# Patient Record
Sex: Female | Born: 1970 | Race: Black or African American | Hispanic: No | Marital: Single | Smoking: Never smoker
Health system: Southern US, Community
[De-identification: ages and names within clinical notes are randomized; demographics above are authoritative.]

## PROBLEM LIST (undated history)

## (undated) DIAGNOSIS — I10 Essential (primary) hypertension: Secondary | ICD-10-CM

## (undated) DIAGNOSIS — G35D Multiple sclerosis, unspecified: Secondary | ICD-10-CM

## (undated) DIAGNOSIS — O00109 Unspecified tubal pregnancy without intrauterine pregnancy: Secondary | ICD-10-CM

## (undated) DIAGNOSIS — G35 Multiple sclerosis: Secondary | ICD-10-CM

## (undated) DIAGNOSIS — E7800 Pure hypercholesterolemia, unspecified: Secondary | ICD-10-CM

## (undated) HISTORY — DX: Essential (primary) hypertension: I10

## (undated) HISTORY — DX: Multiple sclerosis: G35

## (undated) HISTORY — DX: Pure hypercholesterolemia, unspecified: E78.00

## (undated) HISTORY — DX: Multiple sclerosis, unspecified: G35.D

## (undated) HISTORY — DX: Unspecified tubal pregnancy without intrauterine pregnancy: O00.109

---

## 1997-10-02 ENCOUNTER — Other Ambulatory Visit: Admission: RE | Admit: 1997-10-02 | Discharge: 1997-10-02 | Payer: Self-pay | Admitting: Obstetrics and Gynecology

## 1998-10-08 ENCOUNTER — Other Ambulatory Visit: Admission: RE | Admit: 1998-10-08 | Discharge: 1998-10-08 | Payer: Self-pay | Admitting: Obstetrics and Gynecology

## 1999-10-26 ENCOUNTER — Other Ambulatory Visit: Admission: RE | Admit: 1999-10-26 | Discharge: 1999-10-26 | Payer: Self-pay | Admitting: *Deleted

## 2001-02-03 ENCOUNTER — Other Ambulatory Visit: Admission: RE | Admit: 2001-02-03 | Discharge: 2001-02-03 | Payer: Self-pay | Admitting: Obstetrics and Gynecology

## 2002-02-08 ENCOUNTER — Other Ambulatory Visit: Admission: RE | Admit: 2002-02-08 | Discharge: 2002-02-08 | Payer: Self-pay | Admitting: Obstetrics and Gynecology

## 2003-03-05 ENCOUNTER — Other Ambulatory Visit: Admission: RE | Admit: 2003-03-05 | Discharge: 2003-03-05 | Payer: Self-pay | Admitting: Obstetrics and Gynecology

## 2003-12-07 ENCOUNTER — Encounter: Admission: RE | Admit: 2003-12-07 | Discharge: 2003-12-07 | Payer: Self-pay | Admitting: Family Medicine

## 2003-12-14 ENCOUNTER — Encounter: Admission: RE | Admit: 2003-12-14 | Discharge: 2003-12-14 | Payer: Self-pay | Admitting: Family Medicine

## 2004-02-02 HISTORY — PX: OTHER SURGICAL HISTORY: SHX169

## 2004-06-16 ENCOUNTER — Ambulatory Visit (HOSPITAL_COMMUNITY): Admission: RE | Admit: 2004-06-16 | Discharge: 2004-06-16 | Payer: Self-pay | Admitting: Obstetrics and Gynecology

## 2004-06-16 ENCOUNTER — Encounter (INDEPENDENT_AMBULATORY_CARE_PROVIDER_SITE_OTHER): Payer: Self-pay | Admitting: *Deleted

## 2005-06-05 ENCOUNTER — Encounter: Admission: RE | Admit: 2005-06-05 | Discharge: 2005-06-05 | Payer: Self-pay | Admitting: Neurology

## 2007-09-30 ENCOUNTER — Encounter: Admission: RE | Admit: 2007-09-30 | Discharge: 2007-09-30 | Payer: Self-pay | Admitting: Neurology

## 2008-01-29 ENCOUNTER — Emergency Department (HOSPITAL_COMMUNITY): Admission: EM | Admit: 2008-01-29 | Discharge: 2008-01-29 | Payer: Self-pay | Admitting: Family Medicine

## 2010-06-19 NOTE — Op Note (Signed)
NAMEDENAJA, VERHOEVEN               ACCOUNT NO.:  [id number]   MEDICAL RECORD NO.:  [id number]          PATIENT TYPE:  AMB   LOCATION:  SDC                           FACILITY:  [facility]   PHYSICIAN:  Maxie Better, M.D.DATE OF BIRTH:  12-02-1970   DATE OF PROCEDURE:  06/16/2004  DATE OF DISCHARGE:                                 OPERATIVE REPORT   PREOPERATIVE DIAGNOSES:  1.  Abnormal uterine bleeding.  2.  Endometrial mass.   PROCEDURE:  Diagnostic hysteroscopy, resection of endometrial polyps,  dilation and curettage.   POSTOPERATIVE DIAGNOSES:  1.  Abnormal uterine bleeding.  2.  Endometrial mass.   SURGEON:  Maxie Better, M.D.   ANESTHESIA:  General, paracervical block.   PROCEDURE:  Under adequate general anesthesia, the patient was placed in the  dorsal lithotomy position.  She was sterilely prepped and draped in the  usual fashion.  The bladder was catheterized for a small amount of urine.  A  laminaria and vaginal packing were removed.  The vagina had been  subsequently prepped with Betadine solution, and an exam under anesthesia  revealed an anteverted, small uterus.  No adnexal masses could be  appreciated.  A bivalve speculum was placed in the vagina, a single-tooth  tenaculum was placed on the anterior lip of the cervix.  Nesacaine 0.25% 10  mL was injected paracervically at 3 and 9 o'clock.  The cervix was easily  serially dilated up to a #31 Pratt dilator.  A sorbitol-primed resectoscope  was introduced to the uterine cavity.  Both tubal ostia could be seen.  On  the posterior aspect of the endometrial cavity were some polypoid lesions,  which were resected.  The cavity was then carefully inspected, no other  lesions noted.  The endocervical canal was inspected, no lesions noted.  The  resectoscope was removed.  The cavity was then curetted.  Subsequently all  instruments were then removed from the vagina.  Specimens labeled,  endometrial polyps,  endometrial curettings, were sent to pathology.  Estimated blood loss was minimal.  Complication was none.  The patient  tolerated the procedure well, was transferred to recovery in stable  condition.      Barton/MEDQ  D:  06/16/2004  T:  06/16/2004  Job:  [id number]

## 2011-10-19 ENCOUNTER — Other Ambulatory Visit: Payer: Self-pay | Admitting: Neurology

## 2011-10-19 DIAGNOSIS — R6889 Other general symptoms and signs: Secondary | ICD-10-CM

## 2011-10-19 DIAGNOSIS — R9409 Abnormal results of other function studies of central nervous system: Secondary | ICD-10-CM

## 2011-10-26 ENCOUNTER — Ambulatory Visit
Admission: RE | Admit: 2011-10-26 | Discharge: 2011-10-26 | Disposition: A | Discharge status: Home / Self Care | Payer: 59 | Source: Ambulatory Visit | Attending: Neurology | Admitting: Neurology

## 2011-10-26 DIAGNOSIS — R9409 Abnormal results of other function studies of central nervous system: Secondary | ICD-10-CM

## 2011-10-26 DIAGNOSIS — R6889 Other general symptoms and signs: Secondary | ICD-10-CM

## 2011-10-26 MED ORDER — GADOBENATE DIMEGLUMINE 529 MG/ML IV SOLN
15.0000 mL | Freq: Once | INTRAVENOUS | Status: AC | PRN
  Administered 2011-10-26: 15 mL via INTRAVENOUS

## 2011-10-27 ENCOUNTER — Inpatient Hospital Stay: Admission: RE | Admit: 2011-10-27 | Payer: Self-pay | Source: Ambulatory Visit

## 2011-12-09 ENCOUNTER — Other Ambulatory Visit (HOSPITAL_COMMUNITY): Payer: Self-pay | Admitting: Neurology

## 2011-12-09 DIAGNOSIS — G939 Disorder of brain, unspecified: Secondary | ICD-10-CM

## 2011-12-13 ENCOUNTER — Other Ambulatory Visit: Payer: Self-pay | Admitting: Physician Assistant

## 2011-12-14 ENCOUNTER — Inpatient Hospital Stay (HOSPITAL_COMMUNITY): Admission: RE | Admit: 2011-12-14 | Payer: 59 | Source: Ambulatory Visit

## 2011-12-31 ENCOUNTER — Other Ambulatory Visit (HOSPITAL_COMMUNITY): Payer: Self-pay | Admitting: Neurology

## 2011-12-31 ENCOUNTER — Ambulatory Visit (HOSPITAL_COMMUNITY)
Admission: RE | Admit: 2011-12-31 | Discharge: 2011-12-31 | Disposition: A | Discharge status: Home / Self Care | Payer: 59 | Source: Ambulatory Visit | Attending: Neurology | Admitting: Neurology

## 2011-12-31 ENCOUNTER — Inpatient Hospital Stay (HOSPITAL_COMMUNITY): Admission: RE | Admit: 2011-12-31 | Payer: 59 | Source: Ambulatory Visit

## 2011-12-31 ENCOUNTER — Ambulatory Visit (HOSPITAL_COMMUNITY): Payer: 59

## 2011-12-31 DIAGNOSIS — G35 Multiple sclerosis: Secondary | ICD-10-CM

## 2011-12-31 DIAGNOSIS — G35D Multiple sclerosis, unspecified: Secondary | ICD-10-CM

## 2011-12-31 DIAGNOSIS — R209 Unspecified disturbances of skin sensation: Secondary | ICD-10-CM | POA: Insufficient documentation

## 2011-12-31 LAB — PROTEIN AND GLUCOSE, CSF
Glucose, CSF: 54 mg/dL (ref 43–76)
Total  Protein, CSF: 28 mg/dL (ref 15–45)

## 2011-12-31 LAB — CSF CELL COUNT WITH DIFFERENTIAL
RBC Count, CSF: 1 /mm3 — ABNORMAL HIGH
Tube #: 3
WBC, CSF: 2 /mm3 (ref 0–5)

## 2011-12-31 LAB — GRAM STAIN

## 2011-12-31 NOTE — Procedures (Signed)
Successful fluoro-guided LP at L3-4.  12 mL clear CSF was withdrawn and submitted for laboratory evaluation.  Opening pressure 27 cm water.  No immediate complication.

## 2012-06-12 ENCOUNTER — Telehealth: Payer: Self-pay | Admitting: Nurse Practitioner

## 2012-06-12 NOTE — Telephone Encounter (Signed)
R/S appt

## 2012-06-20 ENCOUNTER — Ambulatory Visit: Payer: Self-pay | Admitting: Nurse Practitioner

## 2012-09-12 ENCOUNTER — Encounter: Payer: Self-pay | Admitting: Nurse Practitioner

## 2012-09-13 ENCOUNTER — Ambulatory Visit (INDEPENDENT_AMBULATORY_CARE_PROVIDER_SITE_OTHER): Payer: 59 | Admitting: Nurse Practitioner

## 2012-09-13 ENCOUNTER — Encounter: Payer: Self-pay | Admitting: Nurse Practitioner

## 2012-09-13 VITALS — BP 151/95 | HR 99 | Ht 63.0 in | Wt 165.2 lb

## 2012-09-13 DIAGNOSIS — R9409 Abnormal results of other function studies of central nervous system: Secondary | ICD-10-CM

## 2012-09-13 DIAGNOSIS — G35 Multiple sclerosis: Secondary | ICD-10-CM | POA: Insufficient documentation

## 2012-09-13 DIAGNOSIS — Z79899 Other long term (current) drug therapy: Secondary | ICD-10-CM

## 2012-09-13 NOTE — Patient Instructions (Addendum)
Will check labs today Continue tecfidera twice daily with full meals to prevent side effects Followup in 6 months

## 2012-09-13 NOTE — Progress Notes (Signed)
Reason for visit follow up for Vickie  HPI: Vickie Morrow is a 42 -year-old black female returns today for followup. She was last seen in this office1/9/14 by Dr. Vickey Huger..   She has been followed at [facility] intermittently since December 2005 when she was seen to rule out multiple sclerosis. At that time she presented with paresthesias in her arms and fingers. She had an abnormal MRI of the brain and cervical spine which Dr. Orlin Hilding felt most certainly likely to be multiple sclerosis. There was a plaque at the C2 level on the right, MRI of the brain showed 2-3 lesions suggestive of Vickie. She also has a history of hypertension that would place her at risk for stroke, she did have visual evoked responses done and brainstem auditory evoked responses done both of which were normal. Repeat MRI done in 2007 with minimal change, MRI of the cervical spine at that time was felt to be normal. She was advised to have a lumbar puncture to look for oligoclonal  bands but she declined at that time. She had a repeat MRI of the brain  in August 2009 with no change in the 3 white matter lesions previously seen in the prior study of 2007. Patient still declined a lumbar puncture, and would like to get to every 2 years on her MRI due to cost and no changes. She continues to complain of fatigue, recently been on some blood pressure medications, significant constipation and occasional difficulty sleeping. She has very rare paresthesias. Review of systems is otherwise negative. The patient has been  stable for the last 4 years and a new MRI for check up of silent progression  is recommended.  Never had a CSF test for oligoclonal bands. She is unsure of how often a stable patient should follow up, concerned that the diagnosis is not correct.  MRI positive for one new lesion -12-08-11 and  recommend to have CSF testing.  02-10-2012.Patient learned about the MRI and CSF results, positive for 5 or more oligoclonal bands. She has been told to read up  on Vickie ,  and reading material was given to her on Dec 31 st - for injectable , oral and iv treatments by Dr. Vickey Huger.   She prefers w the  Iv treatment, sicne a girlfriend  uses this.  She is fatigued and  has myalgias, feels depressed often, She wanted to take this into consideration. She is  aware of the  daily versus twcie daily oral medications and their side effects. She  decided on Tecfidera.    09/13/12. Followup visit and patient denies any  spasms, focal weakness, sensory changes, visual changes, speech or swallowing problems, no problems with bowel or bladder function. She is taking and tolerating tecfidera with occasional GI distress however she is not taking the second dose with a full meal and she was encouraged to do so. She does not remember when or if ever she has had an exacerbation of symptoms. No new neurologic complaints   ROS:  Confusion and snoring   Medications Current Outpatient Prescriptions on File Prior to Visit  Medication Sig Dispense Refill  . Biotin 5 MG TABS Take 5 mg by mouth daily.      . Multiple Vitamins-Minerals (HAIR VITAMINS PO) Take 2 capsules by mouth daily.      . valsartan-hydrochlorothiazide (DIOVAN-HCT) 160-25 MG per tablet Take 1 tablet by mouth daily.       No current facility-administered medications on file prior to visit.  Allergies No Known Allergies  Physical Exam General: well developed, well nourished, seated, in no evident distress Head: head normocephalic and atraumatic. Oropharynx benign Neck: supple with no carotid  bruits Cardiovascular: regular rate and rhythm, no murmurs  Neurologic Exam Mental Status: Awake and fully alert.  Follows all commands. Speech and language normal.   Cranial Nerves: Fundoscopic exam reveals sharp disc margins. Pupils equal, briskly reactive to light. Extraocular movements full without nystagmus. Visual fields full to confrontation. Visual acuity 20/40 bilateral. Hearing intact and symmetric to  finger snap.  Face, tongue, palate move normally and symmetrically. Neck flexion and extension normal.  Motor: Normal bulk and tone. Normal strength in all tested extremity muscles.No focal weakness Sensory.: intact to touch and pinprick and vibratory.  Coordination: Rapid alternating movements normal in all extremities. Finger-to-nose and heel-to-shin performed accurately bilaterally. No dysmetria Gait and Station: Arises from chair without difficulty. Stance is normal. Gait demonstrates normal stride length and balance . Able to heel, toe and tandem walk without difficulty.  Reflexes: 2+ and symmetric. Toes downgoing.     ASSESSMENT: Multiple sclerosis currently on tecfidera with rare side effects of GI problems however has not been taking with a full meal.     PLAN: Will check labs today, CBC, CMP Continue tecfidera twice daily with full meals to prevent side effects Followup in 6 months  Vickie Morrow, GNP-BC APRN

## 2012-09-14 LAB — CBC WITH DIFFERENTIAL/PLATELET
Basos: 0 % (ref 0–3)
Eos: 1 % (ref 0–5)
Eosinophils Absolute: 0 10*3/uL (ref 0.0–0.4)
HCT: 37.4 % (ref 34.0–46.6)
Lymphocytes Absolute: 2.1 10*3/uL (ref 0.7–3.1)
MCV: 87 fL (ref 79–97)
Monocytes Absolute: 0.3 10*3/uL (ref 0.1–0.9)
Monocytes: 6 % (ref 4–12)
Neutrophils Absolute: 3.5 10*3/uL (ref 1.4–7.0)
Neutrophils Relative %: 58 % (ref 40–74)
RBC: 4.28 x10E6/uL (ref 3.77–5.28)
RDW: 14.7 % (ref 12.3–15.4)
WBC: 6 10*3/uL (ref 3.4–10.8)

## 2012-09-14 LAB — COMPREHENSIVE METABOLIC PANEL
AST: 13 IU/L (ref 0–40)
Alkaline Phosphatase: 77 IU/L (ref 39–117)
BUN/Creatinine Ratio: 14 (ref 9–23)
CO2: 25 mmol/L (ref 18–29)
Chloride: 103 mmol/L (ref 96–108)
GFR calc Af Amer: 106 mL/min/{1.73_m2} (ref >59)
Sodium: 139 mmol/L (ref 134–144)
Total Bilirubin: 0.3 mg/dL (ref 0.0–1.2)

## 2012-09-14 NOTE — Progress Notes (Signed)
Quick Note:  LMVM for pt that labs looked good. She is to call if questions. ______

## 2012-11-06 ENCOUNTER — Other Ambulatory Visit: Payer: Self-pay

## 2013-02-22 ENCOUNTER — Other Ambulatory Visit: Payer: Self-pay

## 2013-02-22 MED ORDER — DIMETHYL FUMARATE 240 MG PO CPDR: 240.0000 mg | DELAYED_RELEASE_CAPSULE | Freq: Two times a day (BID) | ORAL | Status: DC

## 2013-03-14 ENCOUNTER — Ambulatory Visit (INDEPENDENT_AMBULATORY_CARE_PROVIDER_SITE_OTHER): Payer: 59 | Admitting: Nurse Practitioner

## 2013-03-14 ENCOUNTER — Encounter: Payer: Self-pay | Admitting: Nurse Practitioner

## 2013-03-14 ENCOUNTER — Telehealth: Payer: Self-pay | Admitting: Nurse Practitioner

## 2013-03-14 VITALS — BP 158/102 | HR 105 | Ht 64.0 in | Wt 166.0 lb

## 2013-03-14 DIAGNOSIS — Z79899 Other long term (current) drug therapy: Secondary | ICD-10-CM

## 2013-03-14 DIAGNOSIS — G35 Multiple sclerosis: Secondary | ICD-10-CM

## 2013-03-14 DIAGNOSIS — R9409 Abnormal results of other function studies of central nervous system: Secondary | ICD-10-CM

## 2013-03-14 NOTE — Patient Instructions (Signed)
Computer was down for the visit

## 2013-03-14 NOTE — Telephone Encounter (Signed)
LM for patient to call and schedule 6 month f/u appt with CM/Dohmeier--computers down earlier.

## 2013-03-14 NOTE — Progress Notes (Signed)
[facility]  PATIENT: Vickie Morrow DOB: [birth year]   REASON FOR VISIT:follow up for MS   HISTORY OF PRESENT ILLNESS: Vickie Morrow, 43 year old black female returns for followup. She was last seen in this office 09/13/2012. She has a history of multiple sclerosis. She is currently on tecfidera without side effects. She denies any spasms, focal weakness, visual changes, speech or swallowing problems,  bowel or bladder dysfunction, she has not fallen. She returns for reevaluation.  She has been followed at [facility] intermittently since December 2005 when she was seen to rule out multiple sclerosis. At that time she presented with paresthesias in her arms and fingers. She had an abnormal MRI of the brain and cervical spine which Dr. Jacolyn Reedy felt most certainly likely to be multiple sclerosis. There was a plaque at the C2 level on the right, MRI of the brain showed 2-3 lesions suggestive of MS. She also has a history of hypertension that would place her at risk for stroke, she did have visual evoked responses done and brainstem auditory evoked responses done both of which were normal. Repeat MRI done in 2007 with minimal change, MRI of the cervical spine at that time was felt to be normal. She was advised to have a lumbar puncture to look for oligoclonal bands but she declined at that time. She had a repeat MRI of the brain in August 2009 with no change in the 3 white matter lesions previously seen in the prior study of 2007. Patient still declined a lumbar puncture, and would like to get to every 2 years on her MRI due to cost and no changes. She continues to complain of fatigue, recently been on some blood pressure medications, significant constipation and occasional difficulty sleeping. She has very rare paresthesias. Review of systems is otherwise negative.  The patient has been stable for the last 4 years and a new MRI for check up of silent progression is recommended. Never had a CSF test  for oligoclonal bands. She is unsure of how often a stable patient should follow up, concerned that the diagnosis is not correct.  MRI positive for one new lesion -12-08-11 and recommend to have CSF testing.  02-10-2012.Patient learned about the MRI and CSF results, positive for 5 or more oligoclonal bands. She has been told to read up on MS , and reading material was given to her on Dec 31 st - for injectable , oral and iv treatments by Dr. Brett Fairy.  She prefers w the Iv treatment, sicne a girlfriend uses this.  She is fatigued and has myalgias, feels depressed often, She wanted to take this into consideration. She is aware of the daily versus twcie daily oral medications and their side effects. She decided on Tecfidera.      REVIEW OF SYSTEMS: Full 14 system review of systems performed and notable only for those listed, all others are neg:  Constitutional: N/A  Cardiovascular: N/A  Ear/Nose/Throat: N/A  Skin: N/A  Eyes: N/A  Respiratory: N/A  Gastroitestinal: N/A  Hematology/Lymphatic: N/A  Endocrine: N/A Musculoskeletal:N/A  Allergy/Immunology: N/A  Neurological: N/A Psychiatric: N/A   ALLERGIES: No Known Allergies  HOME MEDICATIONS: Outpatient Prescriptions Prior to Visit  Medication Sig Dispense Refill  . Biotin 5 MG TABS Take 5 mg by mouth daily.      . Dimethyl Fumarate (TECFIDERA) 240 MG CPDR Take 1 capsule (240 mg total) by mouth 2 (two) times daily.  60 capsule  3  . Multiple Vitamins-Minerals (HAIR VITAMINS PO) Take 2  capsules by mouth daily.      . valsartan-hydrochlorothiazide (DIOVAN-HCT) 160-25 MG per tablet Take 1 tablet by mouth daily.       No facility-administered medications prior to visit.    PAST MEDICAL HISTORY: Past Medical History  Diagnosis Date  . Tubal pregnancy   . MS (multiple sclerosis)   . High blood pressure   . High cholesterol     PAST SURGICAL HISTORY: Past Surgical History  Procedure Laterality Date  . Feet surgery  2006     FAMILY HISTORY: Family History  Problem Relation Age of Onset  . Stroke Maternal Grandmother   . Prostate cancer Father   . Alzheimer's disease Paternal Grandmother   . Cancer Maternal Grandfather     Pancreatic     SOCIAL HISTORY: History   Social History  . Marital Status: Single    Spouse Name: N/A    Number of Children: 0  . Years of Education: N/A   Occupational History  . Business Analyst    Social History Main Topics  . Smoking status: Never Smoker   . Smokeless tobacco: Never Used  . Alcohol Use: .5 - 1 oz/week    1-2 drink(s) per week  . Drug Use: No  . Sexual Activity: Not on file   Other Topics Concern  . Not on file   Social History Narrative   Patient lives at home.    Patient is a Air cabin crew.    Patient has never been married.    Patient has no children.            PHYSICAL EXAM  Filed Vitals:   03/14/13 0950  BP: 158/102  Pulse: 105  Height: 5\' 4"  (1.626 m)  Weight: 166 lb (75.297 kg)   Body mass index is 28.48 kg/(m^2).  Generalized: Well developed, in no acute distress  Head: normocephalic and atraumatic,. Oropharynx benign  Neck: Supple, no carotid bruits  Cardiac: Regular rate rhythm, no murmur  Musculoskeletal: No deformity   Neurological examination   Mentation: Alert oriented to time, place, history taking. Follows all commands speech and language fluent  Cranial nerve II-XII: Fundoscopic exam reveals sharp disc margins.Pupils were equal round reactive to light extraocular movements were full, visual field were full on confrontational test. Visual acuity 20/50 left, 2040 right Facial sensation and strength were normal. hearing was intact to finger rubbing bilaterally. Uvula tongue midline. Head turning and shoulder shrug were normal and symmetric.Tongue protrusion into cheek strength was normal. Motor: normal bulk and tone, full strength in the BUE, BLE,  No focal weakness Sensory: normal and symmetric to light touch,  pinprick, and  vibration  Coordination: finger-nose-finger, heel-to-shin bilaterally, no dysmetria Reflexes: Brachioradialis 2/2, biceps 2/2, triceps 2/2, patellar 2/2, Achilles 2/2, plantar responses were flexor bilaterally. Gait and Station: Rising up from seated position without assistance, normal stance,  moderate stride, good arm swing, smooth turning, able to perform tiptoe, and heel walking without difficulty. Tandem gait is steady  DIAGNOSTIC DATA (LABS, IMAGING, TESTING) - I reviewed patient records, labs, notes, testing and imaging myself where available.  Lab Results  Component Value Date   WBC 6.0 09/13/2012   HGB 12.7 09/13/2012   HCT 37.4 09/13/2012   MCV 87 09/13/2012      Component Value Date/Time   NA 139 09/13/2012 0911   K 4.0 09/13/2012 0911   CL 103 09/13/2012 0911   CO2 25 09/13/2012 0911   GLUCOSE 86 09/13/2012 0911   BUN 11 09/13/2012 0911  CREATININE 0.79 09/13/2012 0911   CALCIUM 9.9 09/13/2012 0911   PROT 7.5 09/13/2012 0911   AST 13 09/13/2012 0911   ALT 14 09/13/2012 0911   ALKPHOS 77 09/13/2012 0911   BILITOT 0.3 09/13/2012 0911   GFRNONAA 92 09/13/2012 0911   GFRAA 106 09/13/2012 0911       ASSESSMENT AND PLAN  43 y.o. year old female  has a past medical history of multiple sclerosis currently stable on tecfidera.  Patient will get a CBC and CMP today She will continue her tecfidera She will followup in 6 months Will repeat MRI of the brain at that time Dennie Bible, [facility], [facility], [facility] [address] [place], [place] [address] [phone]

## 2013-03-15 LAB — COMPREHENSIVE METABOLIC PANEL
A/G RATIO: 1.5 (ref 1.1–2.5)
ALT: 11 IU/L (ref 0–32)
AST: 15 IU/L (ref 0–40)
Albumin: 4.5 g/dL (ref 3.5–5.5)
Alkaline Phosphatase: 70 IU/L (ref 39–117)
BILIRUBIN TOTAL: 0.2 mg/dL (ref 0.0–1.2)
BUN/Creatinine Ratio: 14 (ref 9–23)
BUN: 10 mg/dL (ref 6–24)
CALCIUM: 9.8 mg/dL (ref 8.7–10.2)
CO2: 30 mmol/L — ABNORMAL HIGH (ref 18–29)
Chloride: 102 mmol/L (ref 96–108)
Creatinine, Ser: 0.71 mg/dL (ref 0.57–1.00)
GFR, EST AFRICAN AMERICAN: 121 mL/min/{1.73_m2} (ref >59)
GFR, EST NON AFRICAN AMERICAN: 105 mL/min/{1.73_m2} (ref >59)
Globulin, Total: 3.1 g/dL (ref 1.5–4.5)
Glucose: 98 mg/dL (ref 65–99)
POTASSIUM: 3.9 mmol/L (ref 3.5–5.2)
SODIUM: 140 mmol/L (ref 134–144)
Total Protein: 7.6 g/dL (ref 6.0–8.5)

## 2013-03-15 LAB — CBC WITH DIFFERENTIAL/PLATELET
BASOS ABS: 0 10*3/uL (ref 0.0–0.2)
Basos: 0 %
Eos: 0 %
Eosinophils Absolute: 0 10*3/uL (ref 0.0–0.4)
HEMATOCRIT: 37.9 % (ref 34.0–46.6)
HEMOGLOBIN: 13 g/dL (ref 11.1–15.9)
LYMPHS ABS: 2 10*3/uL (ref 0.7–3.1)
Lymphs: 37 %
MCH: 30 pg (ref 26.6–33.0)
MCHC: 34.3 g/dL (ref 31.5–35.7)
MCV: 88 fL (ref 79–97)
MONOCYTES: 5 %
Monocytes Absolute: 0.3 10*3/uL (ref 0.1–0.9)
NEUTROS ABS: 3.1 10*3/uL (ref 1.4–7.0)
Neutrophils Relative %: 58 %
RBC: 4.33 x10E6/uL (ref 3.77–5.28)
RDW: 14.1 % (ref 12.3–15.4)
WBC: 5.5 10*3/uL (ref 3.4–10.8)

## 2013-05-22 ENCOUNTER — Other Ambulatory Visit: Payer: Self-pay

## 2013-06-07 ENCOUNTER — Other Ambulatory Visit: Payer: Self-pay

## 2013-06-07 MED ORDER — DIMETHYL FUMARATE 240 MG PO CPDR: 240.0000 mg | DELAYED_RELEASE_CAPSULE | Freq: Two times a day (BID) | ORAL | Status: DC

## 2013-06-20 ENCOUNTER — Telehealth: Payer: Self-pay | Admitting: Neurology

## 2013-06-20 MED ORDER — DIMETHYL FUMARATE 240 MG PO CPDR: 240.0000 mg | DELAYED_RELEASE_CAPSULE | Freq: Two times a day (BID) | ORAL | Status: DC

## 2013-06-20 NOTE — Telephone Encounter (Signed)
Rx resent to alternate [place] location.

## 2013-06-20 NOTE — Telephone Encounter (Signed)
Tuesday, Pharmacy with [place] stated rx Dimethyl Fumarate ([place]) 240 MG CPDR  was sent to wrong location.  Correct [facility] address s/[address] [place] or fax over to [phone].  Thanks

## 2013-10-10 ENCOUNTER — Encounter (INDEPENDENT_AMBULATORY_CARE_PROVIDER_SITE_OTHER): Payer: Self-pay

## 2013-10-10 ENCOUNTER — Ambulatory Visit (INDEPENDENT_AMBULATORY_CARE_PROVIDER_SITE_OTHER): Payer: 59 | Admitting: Neurology

## 2013-10-10 ENCOUNTER — Encounter: Payer: Self-pay | Admitting: Neurology

## 2013-10-10 VITALS — BP 155/98 | HR 78 | Ht 63.5 in | Wt 166.0 lb

## 2013-10-10 DIAGNOSIS — Z5181 Encounter for therapeutic drug level monitoring: Secondary | ICD-10-CM

## 2013-10-10 DIAGNOSIS — G35 Multiple sclerosis: Secondary | ICD-10-CM

## 2013-10-10 MED ORDER — DIMETHYL FUMARATE 240 MG PO CPDR: 240.0000 mg | DELAYED_RELEASE_CAPSULE | Freq: Two times a day (BID) | ORAL | Status: DC

## 2013-10-10 NOTE — Patient Instructions (Signed)
Dimethyl Fumarate oral delayed-release capsules What is this medicine? DIMETHYL FUMARATE (dye meth il fue ma rate) helps to decrease the number of multiple sclerosis relapses in people with relapsing-remitting forms of the disease. The medicine does not cure multiple sclerosis This medicine may be used for other purposes; ask your health care provider or pharmacist if you have questions. COMMON BRAND NAME(S): Tecfidera What should I tell my health care provider before I take this medicine? They need to know if you have any of these conditions: -immune system problems -infection -an unusual or allergic reaction to dimethyl fumarate, other medicines, foods, dyes, or preservatives -pregnant or trying to get pregnant -breast-feeding How should I use this medicine? Take this medicine by mouth with a glass of water. Follow the directions on the prescription label. Do not cut, crush, or chew this medicine. You can take it with or without food. If it upsets your stomach, take it with food. Take your medicine at regular intervals. Do not take your medicine more often than directed. Do not stop taking except on your doctor's advice. Talk to your pediatrician regarding the use of this medicine in children. Special care may be needed. Overdosage: If you think you've taken too much of this medicine contact a poison control center or emergency room at once. Overdosage: If you think you have taken too much of this medicine contact a poison control center or emergency room at once. NOTE: This medicine is only for you. Do not share this medicine with others. What if I miss a dose? If you miss a dose, take it as soon as you can. If it is almost time for your next dose, take only that dose. Do not take double or extra doses. What may interact with this medicine? Interactions are not expected. This list may not describe all possible interactions. Give your health care provider a list of all the medicines, herbs,  non-prescription drugs, or dietary supplements you use. Also tell them if you smoke, drink alcohol, or use illegal drugs. Some items may interact with your medicine. What should I watch for while using this medicine? Tell your doctor or healthcare professional if your symptoms do not start to get better or of they get worse. You may need blood work done while you are taking this medicine. What side effects may I notice from receiving this medicine? Side effects that you should report to your doctor or health care professional as soon as possible: -allergic reactions like skin rash, itching or hives, swelling of the face, lips, or tongue -fever or chills, sore throat -signs and symptoms of a serious brain infection like changes in vision, confusion, weakness on one side of the body, loss of balance or coordination, loss of memory, or changes in personality Side effects that usually do not require medical attention (Report these to your doctor or health care professional if they continue or are bothersome.): -diarrhea -facial flushing -nausea, vomiting -stomach pain -upset stomach This list may not describe all possible side effects. Call your doctor for medical advice about side effects. You may report side effects to FDA at 1-800-FDA-1088. Where should I keep my medicine? Keep out of the reach of children. Store at room temperature between 15 and 30 degrees C (59 and 86 degrees F). Keep this medicine in the original container. Throw away any unused medicine after 90 days of opening the container. NOTE: This sheet is a summary. It may not cover all possible information. If you have questions about this   medicine, talk to your doctor, pharmacist, or health care provider.  2015, Elsevier/Gold Standard. (2012-12-26 13:56:23)  

## 2013-10-10 NOTE — Progress Notes (Signed)
[facility]  PATIENT: Vickie Morrow DOB: Nov 01, 1970   REASON FOR VISIT:follow up for MS   HISTORY OF PRESENT ILLNESS: Vickie Morrow, 43 year old afro [place] , right handed female returns for followup.   She was last seen in this office 6 month ago . She has a history of relapsing remitting  multiple sclerosis, diagnosed in the year 2003 by Dr. Jacolyn Reedy.  She is currently on tecfidera without side effects. She denies any spasms, focal weakness, visual changes, speech or swallowing problems,  bowel or bladder dysfunction, she has not fallen.  She returns for reevaluation of the Tacfidera effect, has no longer flushing or nausea and tolerated the medication well after 21 days and when taken with food. She has no numbness except for the left face .   She has been followed at [facility] intermittently since December 2005 when she was seen to rule out multiple sclerosis.  At that time she presented with paresthesias in her arms and fingers. She had an abnormal MRI of the brain and cervical spine which Dr. Jacolyn Reedy felt most certainly likely to be multiple sclerosis. There was a plaque at the C2 level on the right, MRI of the brain showed 2-3 lesions suggestive of MS. She also has a history of hypertension that would place her at risk for stroke, she did have visual evoked responses done and brainstem auditory evoked responses done both of which were normal. Repeat MRI done in 2007 with minimal change, MRI of the cervical spine at that time was felt to be normal. She was advised to have a lumbar puncture to look for oligoclonal bands but she declined at that time. She had a repeat MRI of the brain in August 2009 with no change in the 3 white matter lesions previously seen in the prior study of 2007. Patient still declined a lumbar puncture, and would like to get to every 2 years on her MRI due to cost and no changes. She continues to complain of fatigue, recently been on some blood pressure  medications, significant constipation and occasional difficulty sleeping. She has very rare paresthesias. Review of systems is otherwise negative.  The patient has been stable for the last 4 years and a new MRI for check up of silent progression is recommended. Never had a CSF test for oligoclonal bands. She is unsure of how often a stable patient should follow up, concerned that the diagnosis is not correct.  MRI positive for one new lesion -12-08-11 and recommend to have CSF testing.   02-10-2012.Patient learned about the MRI and CSF results, positive for 5 or more oligoclonal bands. She has been told to read up on MS , and reading material was given to her on Dec 31 st - for injectable , oral and iv treatments by Dr. Brett Fairy.  She is fatigued and has myalgias, feels depressed often,  She wanted to take this into consideration. She is aware of the daily versus twcie daily oral medications and their side effects. She decided onTecfidera.      REVIEW OF SYSTEMS: Full 14 system review of systems performed and notable only for those listed, all others are neg:  Constitutional: N/A  Cardiovascular: N/A  Ear/Nose/Throat: N/A  Skin: N/A  Eyes: N/A  Respiratory: N/A  Gastroitestinal: N/A  Hematology/Lymphatic: N/A  Endocrine: N/A Musculoskeletal:N/A  Allergy/Immunology: N/A  Neurological: N/A Psychiatric: N/A   ALLERGIES: No Known Allergies  HOME MEDICATIONS: Outpatient Prescriptions Prior to Visit  Medication Sig Dispense Refill  . Biotin 5  MG TABS Take 5 mg by mouth daily.      . Dimethyl Fumarate (TECFIDERA) 240 MG CPDR Take 1 capsule (240 mg total) by mouth 2 (two) times daily.  180 capsule  1  . Multiple Vitamins-Minerals (HAIR VITAMINS PO) Take 2 capsules by mouth daily.      . valsartan-hydrochlorothiazide (DIOVAN-HCT) 160-25 MG per tablet Take 1 tablet by mouth daily.       No facility-administered medications prior to visit.    PAST MEDICAL HISTORY: Past Medical History    Diagnosis Date  . Tubal pregnancy   . MS (multiple sclerosis)   . High blood pressure   . High cholesterol     PAST SURGICAL HISTORY: Past Surgical History  Procedure Laterality Date  . Feet surgery  2006    FAMILY HISTORY: Family History  Problem Relation Age of Onset  . Stroke Maternal Grandmother   . Prostate cancer Father   . Alzheimer's disease Paternal Grandmother   . Cancer Maternal Grandfather     Pancreatic     SOCIAL HISTORY: History   Social History  . Marital Status: Single    Spouse Name: N/A    Number of Children: 0  . Years of Education: College   Occupational History  . Scientist, forensic   .     Social History Main Topics  . Smoking status: Never Smoker   . Smokeless tobacco: Never Used  . Alcohol Use: 0.5 - 1.0 oz/week    1-2 drink(s) per week  . Drug Use: No  . Sexual Activity: Not on file   Other Topics Concern  . Not on file   Social History Narrative   Patient lives at home.    Patient is a Scientist, forensic.    Patient has never been married.    Patient has no children.    Patient has a college education.   Patient is right-handed.   Patient does not drink any caffeine.           PHYSICAL EXAM  There were no vitals filed for this visit. There is no weight on file to calculate BMI.  Generalized: Well developed, in no acute distress  Head: normocephalic and atraumatic,. Oropharynx benign  Neck: Supple, no carotid bruits  Cardiac: Regular rate rhythm, no murmur  Musculoskeletal: No deformity   Neurological examination   Mentation: Alert oriented to time, place, history taking. Follows all commands speech and language fluent  Cranial nerve II-XII: Fundoscopic exam reveals sharp disc margins. Pupils were equal round reactive to light extraocular movements were full, visual field were full on confrontational test.  Visual acuity 20/50 left, 2040 right.  Facial sensation : left face feels numb , lower face only , no TMJ and  no claudication with chewing. Facial strength is  normal.  Her hearing was intact to finger rubbing bilaterally.  Uvula and tongue midline. Head turning and shoulder shrug were normal and symmetric.Motor: normal bulk and tone,No focal weakness Sensory: normal and symmetric to light touch, pinprick, and vibration  Coordination: finger-nose-finger, no dysmetria Reflexes:2/2, plantar responses were flexor bilaterally. Gait and Station: Rising up from seated position without assistance, without bracing ., normal stance- not wide based.,  moderate stride, good arm swing, smooth turning, able to perform tiptoe, and heel walking without difficulty. Tandem gait is steady.  Left foot feels some times weaker, "dragging" .  DIAGNOSTIC DATA (LABS, IMAGING, TESTING) - I reviewed patient records, labs, notes, testing and imaging myself where available.   Lab  Results  Component Value Date   WBC 5.5 03/14/2013   HGB 13.0 03/14/2013   HCT 37.9 03/14/2013   MCV 88 03/14/2013      Component Value Date/Time   NA 140 03/14/2013 1016   K 3.9 03/14/2013 1016   CL 102 03/14/2013 1016   CO2 30* 03/14/2013 1016   GLUCOSE 98 03/14/2013 1016   BUN 10 03/14/2013 1016   CREATININE 0.71 03/14/2013 1016   CALCIUM 9.8 03/14/2013 1016   PROT 7.6 03/14/2013 1016   AST 15 03/14/2013 1016   ALT 11 03/14/2013 1016   ALKPHOS 70 03/14/2013 1016   BILITOT 0.2 03/14/2013 1016   GFRNONAA 105 03/14/2013 1016   GFRAA 121 03/14/2013 1016       ASSESSMENT AND PLAN  43 y.o. year old female  has a past medical history of multiple sclerosis currently stable on tecfidera.  Patient will get a CBC and CMP today She will continue her tecfidera at 240 mg bid po. She will followup in 6 months with Vickie Morrow.  Ordered today  MRI of the brain . Walking test with next visit , baseline for Ampyra.    [place], [facility] [address], [place], [place] [address] [phone]

## 2013-10-11 LAB — CBC WITH DIFFERENTIAL
BASOS: 0 %
Basophils Absolute: 0 10*3/uL (ref 0.0–0.2)
EOS ABS: 0.1 10*3/uL (ref 0.0–0.4)
EOS: 1 %
HCT: 38.3 % (ref 34.0–46.6)
HEMOGLOBIN: 13.1 g/dL (ref 11.1–15.9)
IMMATURE GRANS (ABS): 0 10*3/uL (ref 0.0–0.1)
IMMATURE GRANULOCYTES: 0 %
LYMPHS: 50 %
Lymphocytes Absolute: 2.2 10*3/uL (ref 0.7–3.1)
MCH: 30.3 pg (ref 26.6–33.0)
MCHC: 34.2 g/dL (ref 31.5–35.7)
MCV: 89 fL (ref 79–97)
Monocytes Absolute: 0.3 10*3/uL (ref 0.1–0.9)
Monocytes: 6 %
NEUTROS PCT: 43 %
Neutrophils Absolute: 1.9 10*3/uL (ref 1.4–7.0)
Platelets: 283 10*3/uL (ref 150–379)
RBC: 4.32 x10E6/uL (ref 3.77–5.28)
RDW: 13.7 % (ref 12.3–15.4)
WBC: 4.5 10*3/uL (ref 3.4–10.8)

## 2013-10-11 LAB — COMPREHENSIVE METABOLIC PANEL
ALT: 14 IU/L (ref 0–32)
AST: 13 IU/L (ref 0–40)
Albumin/Globulin Ratio: 1.5 (ref 1.1–2.5)
Albumin: 4.5 g/dL (ref 3.5–5.5)
Alkaline Phosphatase: 80 IU/L (ref 39–117)
BUN/Creatinine Ratio: 13 (ref 9–23)
BUN: 9 mg/dL (ref 6–24)
CALCIUM: 9.4 mg/dL (ref 8.7–10.2)
CHLORIDE: 98 mmol/L (ref 97–108)
CO2: 27 mmol/L (ref 18–29)
Creatinine, Ser: 0.71 mg/dL (ref 0.57–1.00)
GFR calc Af Amer: 120 mL/min/{1.73_m2} (ref >59)
GFR calc non Af Amer: 104 mL/min/{1.73_m2} (ref >59)
Globulin, Total: 3 g/dL (ref 1.5–4.5)
Glucose: 80 mg/dL (ref 65–99)
POTASSIUM: 4.1 mmol/L (ref 3.5–5.2)
SODIUM: 140 mmol/L (ref 134–144)
Total Bilirubin: <0.2 mg/dL (ref 0.0–1.2)
Total Protein: 7.5 g/dL (ref 6.0–8.5)

## 2013-10-29 ENCOUNTER — Telehealth: Payer: Self-pay | Admitting: Neurology

## 2013-10-29 NOTE — Telephone Encounter (Signed)
Patient called and wanted to know if Tecfidera causes vaginal dryness.  She relayed she has been on Tecfidera for about a year and this problem has been progressive.

## 2013-10-30 NOTE — Telephone Encounter (Signed)
Left message relaying that vaginal dryness was not noted as a side effect in any of the clinical trials, also the doctor was not aware of this being a side effect.  Told to call if she had further questions.

## 2013-10-30 NOTE — Telephone Encounter (Signed)
I am not aware of the vaginal dryness being a side effect, I will ask jessica banks to contact the manufacturer .

## 2013-10-30 NOTE — Telephone Encounter (Signed)
I contacted [employer].  This was not a side effect noted in any of the clinical trials.

## 2014-04-17 ENCOUNTER — Encounter: Payer: Self-pay | Admitting: Neurology

## 2014-04-17 ENCOUNTER — Ambulatory Visit (INDEPENDENT_AMBULATORY_CARE_PROVIDER_SITE_OTHER): Payer: 59 | Admitting: Neurology

## 2014-04-17 VITALS — BP 167/101 | HR 83 | Resp 18 | Ht 64.0 in | Wt 174.0 lb

## 2014-04-17 DIAGNOSIS — Z9119 Patient's noncompliance with other medical treatment and regimen: Secondary | ICD-10-CM

## 2014-04-17 DIAGNOSIS — G35 Multiple sclerosis: Secondary | ICD-10-CM | POA: Diagnosis not present

## 2014-04-17 DIAGNOSIS — Z91199 Patient's noncompliance with other medical treatment and regimen due to unspecified reason: Secondary | ICD-10-CM | POA: Insufficient documentation

## 2014-04-17 MED ORDER — DIMETHYL FUMARATE 120 & 240 MG PO MISC: 120.0000 mg | Freq: Two times a day (BID) | ORAL | Status: DC

## 2014-04-17 NOTE — Addendum Note (Signed)
Addended by: Larey Seat on: 04/17/2014 04:55 PM   Modules accepted: Orders

## 2014-04-17 NOTE — Patient Instructions (Signed)
Dimethyl Fumarate oral delayed-release capsules What is this medicine? DIMETHYL FUMARATE (dye meth il fue ma rate) helps to decrease the number of multiple sclerosis relapses in people with relapsing-remitting forms of the disease. The medicine does not cure multiple sclerosis This medicine may be used for other purposes; ask your health care provider or pharmacist if you have questions. COMMON BRAND NAME(S): Tecfidera What should I tell my health care provider before I take this medicine? They need to know if you have any of these conditions: -immune system problems -infection -an unusual or allergic reaction to dimethyl fumarate, other medicines, foods, dyes, or preservatives -pregnant or trying to get pregnant -breast-feeding How should I use this medicine? Take this medicine by mouth with a glass of water. Follow the directions on the prescription label. Do not cut, crush, or chew this medicine. You can take it with or without food. If it upsets your stomach, take it with food. Take your medicine at regular intervals. Do not take your medicine more often than directed. Do not stop taking except on your doctor's advice. Talk to your pediatrician regarding the use of this medicine in children. Special care may be needed. Overdosage: If you think you've taken too much of this medicine contact a poison control center or emergency room at once. Overdosage: If you think you have taken too much of this medicine contact a poison control center or emergency room at once. NOTE: This medicine is only for you. Do not share this medicine with others. What if I miss a dose? If you miss a dose, take it as soon as you can. If it is almost time for your next dose, take only that dose. Do not take double or extra doses. What may interact with this medicine? Interactions are not expected. This list may not describe all possible interactions. Give your health care provider a list of all the medicines, herbs,  non-prescription drugs, or dietary supplements you use. Also tell them if you smoke, drink alcohol, or use illegal drugs. Some items may interact with your medicine. What should I watch for while using this medicine? Tell your doctor or healthcare professional if your symptoms do not start to get better or of they get worse. You may need blood work done while you are taking this medicine. What side effects may I notice from receiving this medicine? Side effects that you should report to your doctor or health care professional as soon as possible: -allergic reactions like skin rash, itching or hives, swelling of the face, lips, or tongue -fever or chills, sore throat -signs and symptoms of a serious brain infection like changes in vision, confusion, weakness on one side of the body, loss of balance or coordination, loss of memory, or changes in personality Side effects that usually do not require medical attention (Report these to your doctor or health care professional if they continue or are bothersome.): -diarrhea -facial flushing -nausea, vomiting -stomach pain -upset stomach This list may not describe all possible side effects. Call your doctor for medical advice about side effects. You may report side effects to FDA at 1-800-FDA-1088. Where should I keep my medicine? Keep out of the reach of children. Store at room temperature between 15 and 30 degrees C (59 and 86 degrees F). Keep this medicine in the original container. Throw away any unused medicine after 90 days of opening the container. NOTE: This sheet is a summary. It may not cover all possible information. If you have questions about this  medicine, talk to your doctor, pharmacist, or health care provider.  2015, Elsevier/Gold Standard. (2012-12-26 13:56:23)

## 2014-04-17 NOTE — Progress Notes (Signed)
[facility]  PATIENT: Vickie Morrow DOB: [birth year]   REASON FOR VISIT:follow up for MS, tacfidera user.    HISTORY OF PRESENT ILLNESS: Vickie Morrow, 44 year old afro [place] , right handed female returns for follow up and medication monitoring.Marland Kitchen   PMHX:  She has been followed at [facility] intermittently since December 2005 when she was seen to rule out multiple sclerosis. She has a history of relapsing remitting  multiple sclerosis, diagnosed in the year 2003 by Dr. Jacolyn Reedy.   At that time she presented with paresthesias in her arms and fingers. She had an abnormal MRI of the brain and cervical spine which Dr. Jacolyn Reedy felt most certainly likely to be multiple sclerosis. There was a plaque at the C2 level on the right, MRI of the brain showed 2-3 lesions suggestive of MS. She also has a history of hypertension that would place her at risk for stroke, she did have visual evoked responses done and brainstem auditory evoked responses done both of which were normal. Repeat MRI done in 2007 with minimal change, MRI of the cervical spine at that time was felt to be normal. She was advised to have a lumbar puncture to look for oligoclonal bands but she declined at that time. She had a repeat MRI of the brain in August 2009 with no change in the 3 white matter lesions previously seen in the prior study of 2007. Patient still declined a lumbar puncture, and would like to get to every 2 years on her MRI due to cost and no changes. She continues to complain of fatigue, recently been on some blood pressure medications, significant constipation and occasional difficulty sleeping. She has very rare paresthesias. Review of systems is otherwise negative.  The patient has been stable for the last 4 years and a new MRI for check up of silent progression is recommended. Never had a CSF test for oligoclonal bands. She is unsure of how often a stable patient should follow up, concerned that the diagnosis is  not correct.  MRI positive for one new lesion -12-08-11 and recommend to have CSF testing.   02-10-2012.Patient learned about the MRI and CSF results, positive for 5 or more oligoclonal bands. She has been told to read up on MS , and reading material was given to her on Dec 31 st - for injectable , oral and iv treatments by Dr. Brett Fairy.  She is fatigued and has myalgias, feels depressed often,  She wanted to take this into consideration. She is aware of the daily versus twcie daily oral medications and their side effects. She decided onTecfidera.  She was last seen in this office 6 month ago by myself.   She is currently on tecfidera without side effects. She denies any spasms, focal weakness, visual changes, speech or swallowing problems,  bowel or bladder dysfunction, she has not fallen.  She returns for reevaluation of the Tacfidera effect, has no longer flushing or nausea and tolerated the medication well after 21 days and when taken with food. She has no numbness except for the left face .        REVIEW OF SYSTEMS: Full 14 system review of systems performed and notable only for those listed, all others are neg:  Constitutional: N/A  Cardiovascular: N/A  Ear/Nose/Throat: N/A  Skin: N/A  Eyes: N/A  Respiratory: N/A  Gastroitestinal: N/A  Hematology/Lymphatic: N/A  Endocrine: N/A Musculoskeletal:N/A  Allergy/Immunology: N/A  Neurological: N/A Psychiatric: N/A   ALLERGIES: No Known Allergies  HOME MEDICATIONS: Outpatient  Prescriptions Prior to Visit  Medication Sig Dispense Refill  . Biotin 5 MG TABS Take 5 mg by mouth daily.    . Multiple Vitamins-Minerals (HAIR VITAMINS PO) Take 2 capsules by mouth daily.    . Dimethyl Fumarate (TECFIDERA) 240 MG CPDR Take 1 capsule (240 mg total) by mouth 2 (two) times daily. (Patient not taking: Reported on 04/17/2014) 180 capsule 1  . valsartan-hydrochlorothiazide (DIOVAN-HCT) 160-25 MG per tablet Take 1 tablet by mouth daily.     No  facility-administered medications prior to visit.    PAST MEDICAL HISTORY: Past Medical History  Diagnosis Date  . Tubal pregnancy   . MS (multiple sclerosis)   . High blood pressure   . High cholesterol     PAST SURGICAL HISTORY: Past Surgical History  Procedure Laterality Date  . Feet surgery  2006    FAMILY HISTORY: Family History  Problem Relation Age of Onset  . Stroke Maternal Grandmother   . Prostate cancer Father   . Alzheimer's disease Paternal Grandmother   . Cancer Maternal Grandfather     Pancreatic     SOCIAL HISTORY: History   Social History  . Marital Status: Single    Spouse Name: N/A  . Number of Children: 0  . Years of Education: College   Occupational History  . Scientist, forensic   .     Social History Main Topics  . Smoking status: Never Smoker   . Smokeless tobacco: Never Used  . Alcohol Use: 0.5 - 1.0 oz/week    1-2 drink(s) per week  . Drug Use: No  . Sexual Activity: Not on file   Other Topics Concern  . Not on file   Social History Narrative   Patient lives at home.  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  Patient has no children.  Patient has a college education. Patient is right-handed. Patient does not drink any caffeine.       PHYSICAL EXAM  Filed Vitals:   04/17/14 1556  BP: 167/101  Pulse: 83  Resp: 18  Height: 5\' 4"  (1.626 m)  Weight: 174 lb (78.926 kg)   Body mass index is 29.85 kg/(m^2).  Generalized: Well developed, in no acute distress  Head: normocephalic and atraumatic,. Oropharynx benign  Neck: Supple, no carotid bruits  Cardiac: Regular rate rhythm, no murmur  Musculoskeletal: No deformity   Neurological examination   Mentation: Alert oriented to time, place, history taking. Follows all commands speech and language fluent. Well groomed.   Cranial nerve , she has not noticed a loss of smell or taste sensation. No olfactory or gustatory hallucinations. Fundoscopic exam  reveals sharp disc margins. No edema and no pallor. No abnormal eye movements.  Pupils were equal round reactive to light extraocular movements were full, visual field were full on confrontational test.  Visual acuity 20/50 left, 2040 right. Facial sensation : left face feelt numb in the  lower face only - this has resolved.   no TMJ and no claudication with chewing. Facial strength is  normal.  Her hearing was intact to finger rubbing bilaterally.  Uvula and tongue midline. Head turning and shoulder shrug were normal and symmetric. Motor: normal bulk and tone,No focal weakness Sensory: normal and symmetric to light touch, pinprick, and vibration ,  Coordination: finger-nose-finger, no dysmetria, no ataxia and no tremor. The patient has not noticed any change in her handwriting, and she reports no difficulties with buttoning or using fine motor skills otherwise  such as reminding a watch, putting a time through a needle etc. Reflexes:2/2, patella brisk, 2 plus.  plantar responses were flexor bilaterally. Gait and Station: Rising up from seated position without assistance, without bracing ., normal stance- not wide based.,  moderate stride, good arm swing, smooth turning, Left foot feels some times weaker, "dragging" . She was very able to do a tandem gait which appears steady she can turn with 2 steps she does not drift she is able to walk on her tiptoes and walk on heels. Her Romberg test is negative with outstretched arms.  DIAGNOSTIC DATA (LABS, IMAGING, TESTING) - I reviewed patient records, labs, notes, testing and imaging myself where available.   Lab Results  Component Value Date   WBC 4.5 10/10/2013   HGB 13.1 10/10/2013   HCT 38.3 10/10/2013   MCV 89 10/10/2013   PLT 283 10/10/2013      Component Value Date/Time   NA 140 10/10/2013 1614   K 4.1 10/10/2013 1614   CL 98 10/10/2013 1614   CO2 27 10/10/2013 1614   GLUCOSE 80 10/10/2013 1614   BUN 9 10/10/2013 1614   CREATININE  0.71 10/10/2013 1614   CALCIUM 9.4 10/10/2013 1614   PROT 7.5 10/10/2013 1614   AST 13 10/10/2013 1614   ALT 14 10/10/2013 1614   ALKPHOS 80 10/10/2013 1614   BILITOT <0.2 10/10/2013 1614   GFRNONAA 104 10/10/2013 1614   GFRAA 120 10/10/2013 1614       ASSESSMENT AND PLAN  44 y.o. year old female  has a past medical history of multiple sclerosis currently stable on tecfidera.  Patient will get a CBC and CMP today. The patient is continued Tecfidera in late October early November. She's not quite sure why. She did not have significant GI side effects no depression no bruising no weight gain. She will resume  her tecfidera again at starter dose.  She will followup in 6 months with Cecille Rubin.  Ordered today  MRI of the brain . Walking test with next visit , baseline for Ampyra.    [place], [facility] [address] [place], [place] [address] [phone]

## 2014-11-28 NOTE — Telephone Encounter (Signed)
Error

## 2015-03-06 ENCOUNTER — Ambulatory Visit (INDEPENDENT_AMBULATORY_CARE_PROVIDER_SITE_OTHER): Payer: 59 | Admitting: Neurology

## 2015-03-06 ENCOUNTER — Encounter: Payer: Self-pay | Admitting: Neurology

## 2015-03-06 VITALS — BP 144/92 | HR 88 | Resp 20 | Ht 63.5 in | Wt 166.0 lb

## 2015-03-06 DIAGNOSIS — G4730 Sleep apnea, unspecified: Secondary | ICD-10-CM

## 2015-03-06 DIAGNOSIS — G35 Multiple sclerosis: Secondary | ICD-10-CM

## 2015-03-06 DIAGNOSIS — G4719 Other hypersomnia: Secondary | ICD-10-CM

## 2015-03-06 DIAGNOSIS — R0683 Snoring: Secondary | ICD-10-CM

## 2015-03-06 DIAGNOSIS — G35D Multiple sclerosis, unspecified: Secondary | ICD-10-CM

## 2015-03-06 NOTE — Progress Notes (Signed)
[facility]  PATIENT: Vickie Morrow DOB: 1970/06/23   REASON FOR VISIT:follow up for MS, tacfidera user.    HISTORY OF PRESENT ILLNESS: Vickie Morrow, 45 year old afro [place] , right handed female returns for follow up and medication monitoring. She is seen today upon request of her gynecologist, Dr. Eduard Clos, for a sleep consultation. She has last been seen in 2016 by Cecille Rubin, NP for tacfidera  refills.   PMHX: She has been followed at [facility] intermittently since December 2005 when she was seen to rule out multiple sclerosis. She has a history of relapsing remitting  multiple sclerosis, diagnosed in the year 2003 by Dr. Jacolyn Reedy.   At that time she presented with paresthesias in her arms and fingers. She had an abnormal MRI of the brain and cervical spine which Dr. Jacolyn Reedy felt most certainly likely to be multiple sclerosis. There was a plaque at the C2 level on the right, MRI of the brain showed 2-3 lesions suggestive of MS. She also has a history of hypertension that would place her at risk for stroke, she did have visual evoked responses done and brainstem auditory evoked responses done both of which were normal. Repeat MRI done in 2007 with minimal change, MRI of the cervical spine at that time was felt to be normal. She was advised to have a lumbar puncture to look for oligoclonal bands but she declined at that time. She had a repeat MRI of the brain in August 2009 with no change in the 3 white matter lesions previously seen in the prior study of 2007. Patient still declined a lumbar puncture, and would like to get to every 2 years on her MRI due to cost and no changes. She continues to complain of fatigue, recently been on some blood pressure medications, significant constipation and occasional difficulty sleeping. She has very rare paresthesias. Review of systems is otherwise negative.  The patient has been stable for the last 4 years and a new MRI for check up  of silent progression is recommended. Never had a CSF test for oligoclonal bands. She is unsure of how often a stable patient should follow up, concerned that the diagnosis is not correct.  MRI positive for one new lesion -12-08-11 and recommend to have CSF testing.   02-10-2012.Patient learned about the MRI and CSF results, positive for 5 or more oligoclonal bands. She has been told to read up on MS , and reading material was given to her on Dec 31 st - for injectable , oral and iv treatments by Dr. Brett Fairy.  She is fatigued and has myalgias, feels depressed often,  She wanted to take this into consideration. She is aware of the daily versus twcie daily oral medications and their side effects. She decided onTecfidera.  She was last seen in this office 6 month ago by myself.   She is currently on tecfidera without side effects. She denies any spasms, focal weakness, visual changes, speech or swallowing problems,  bowel or bladder dysfunction, she has not fallen.She returns for reevaluation of the Tacfidera effect, has no longer flushing or nausea and tolerated the medication well after 21 days and when taken with food. She has no numbness except for the left face .   Sleep consultation from the second Feb. 2017, Vickie Morrow reports that she is sleepy and fatigued, both symptoms could be attributed to MS but at least her sleepiness has exacerbated over the last year according to her referring physician's note.  The patient  has been told that she snores, and she has been noted to hold her breath de facto having apneas. Her bedtime is usually around 8:30 PM and she will fall asleep within 5 minutes. She may Wake up once or twice at night sometimes to go to the bathroom, and she will sleep again until she  wakes up by stopping to breathe or gasping for air. She prefers to fall asleep on the side but sometimes wakes up on her back. 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 She works from 8 AM to 5 PM and sometimes struggles at work with staying awake. If she has the irresistible urge to go to sleep she will walk for a while, usually she gets sleepier if she is not physically active or not mentally challenged or stimulated. Once she is at home she feels ready to take a nap, with nap may last 2-3 hours before her real bedtime begins. She still feels not necessarily rested and refreshed and remains able to fall asleep at night. She describes hypersomnia. Her bedroom at home is cool, quiet and dark. She lives alone. If she naps she is usually in bed also. She has not found 30 minute naps to be helpful on the contrary she craves more sleep. In the morning she feels somewhat restored but this feeling doesn't last for long. She wakes with a dry mouth but not headaches. She does not use any coffee in any form, she does not use tobacco, alcohol use may be on the weekends but not routinely. 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 She has been diagnosed with MS in 2003.    REVIEW OF SYSTEMS: Full 14 system review of systems performed and notable only for those listed, all others are neg:  How likely are you to doze in the following situations: 0 = not likely, 1 = slight chance, 2 = moderate chance, 3 = high chance  Sitting and Reading?2 Watching Television?3 Sitting inactive in a public place (theater or meeting)?2 Lying down in the afternoon when circumstances permit?3 Sitting and talking to someone?2 Sitting quietly after lunch without alcohol?0 In a car, while stopped for a few minutes in traffic?0 As a passenger in a car for an hour without a break?3  Total = 15  Allergy/Immunology: MS Neurological: MS related fatigue and numbness. Psychiatric: anxiety    ALLERGIES: No Known Allergies  HOME MEDICATIONS: Outpatient Prescriptions Prior to Visit  Medication Sig Dispense Refill  . Biotin 5 MG TABS Take 5 mg by  mouth daily.    . Dimethyl Fumarate 120 & 240 MG MISC Take 120 mg by mouth 2 (two) times daily. 60 each 0  . Multiple Vitamins-Minerals (HAIR VITAMINS PO) Take 2 capsules by mouth daily.     No facility-administered medications prior to visit.    PAST MEDICAL HISTORY: Past Medical History  Diagnosis Date  . Tubal pregnancy   . MS (multiple sclerosis) ([place])   . High blood pressure   . High cholesterol     PAST SURGICAL HISTORY: Past Surgical History  Procedure Laterality Date  . Feet surgery  2006    FAMILY HISTORY: Family History  Problem Relation Age of Onset  . Stroke Maternal Grandmother   . Prostate cancer Father   . Alzheimer's disease Paternal Grandmother   . Cancer Maternal Grandfather     Pancreatic  SOCIAL HISTORY: Social History   Social History  . Marital Status: Single    Spouse Name: N/A  . Number of Children: 0  . Years of Education: College   Occupational History  . Scientist, forensic   .     Social History Main Topics  . Smoking status: Never Smoker   . Smokeless tobacco: Never Used  . Alcohol Use: 0.5 - 1.0 oz/week    1-2 drink(s) per week  . Drug Use: No  . Sexual Activity: Not on file   Other Topics Concern  . Not on file   Social History Narrative   Patient lives at home.    Patient is a Scientist, forensic.    Patient has never been married.    Patient has no children.    Patient has a college education.   Patient is right-handed.   Patient does not drink any caffeine.           PHYSICAL EXAM  Filed Vitals:   03/06/15 1125  BP: 144/92  Pulse: 88  Resp: 20  Height: 5' 3.5" (1.613 m)  Weight: 166 lb (75.297 kg)   Body mass index is 28.94 kg/(m^2).   The patient is a slightly elevated body mass index, mild retrognathia, Mallampati grade 3, neck circumference 15 inches, Unrestricted nasal airflow. She does have a TMJ click.  Generalized: Well developed, in no acute distress  Head: normocephalic and atraumatic,.  Oropharynx benign  Neck: Supple, no carotid bruits  Cardiac: Regular rate rhythm, no murmur  Musculoskeletal: No deformity.  Neurological examination   Mentation: Alert oriented to time, place, history taking. Follows all commands speech and language fluent. Well groomed.   Cranial nerve , she has not noticed a loss of smell or taste sensation.  No olfactory or gustatory hallucinations. normal eye movements.  Pupils were equal round reactive to light extraocular movements were full, visual field were full on confrontational test.  Visual acuity 20/50 left, 2040 right. Facial sensation : left face feelt numb in the  lower face only - this has resolved.    no TMJ and no claudication with chewing. Facial strength is normal.  Her hearing was intact to finger rubbing bilaterally.  Uvula and tongue midline. Head turning and shoulder shrug were normal and symmetric. Motor: normal bulk and tone,No focal weakness Sensory: normal and symmetric to light touch, pinprick, and vibration ,  Coordination: finger-nose-finger, no dysmetria, no ataxia and no tremor.  The patient has not noticed any change in her handwriting, and she reports no difficulties with buttoning or using fine motor skills otherwise such as reminding a watch, putting a time through a needle etc. Reflexes:2/2, patella brisk, 2 plus. Plantar responses were down going .  Gait and Station: Rising up from seated position without assistance, without bracing ., normal stance- not wide based.,  moderate stride, good arm swing, smooth turning, Left foot feels some times weaker, "dragging".  She was very able to do a tandem gait which appears steady she can turn with 2 steps,  she does not drift ,she is able to walk on her tiptoes and walk on heels.  Her Romberg test is negative with outstretched arms.  DIAGNOSTIC DATA (LABS, IMAGING, TESTING)   Lab Results  Component Value Date   WBC 4.5 10/10/2013   HGB 13.1 10/10/2013   HCT 38.3  10/10/2013   MCV 89 10/10/2013   PLT 283 10/10/2013      Component Value Date/Time   NA 140 10/10/2013 1614  K 4.1 10/10/2013 1614   CL 98 10/10/2013 1614   CO2 27 10/10/2013 1614   GLUCOSE 80 10/10/2013 1614   BUN 9 10/10/2013 1614   CREATININE 0.71 10/10/2013 1614   CALCIUM 9.4 10/10/2013 1614   PROT 7.5 10/10/2013 1614   ALBUMIN 4.5 10/10/2013 1614   AST 13 10/10/2013 1614   ALT 14 10/10/2013 1614   ALKPHOS 80 10/10/2013 1614   BILITOT <0.2 10/10/2013 1614   GFRNONAA 104 10/10/2013 1614   GFRAA 120 10/10/2013 1614    This  visit on an established MS patient referred by Gyn for a sleep consultation took 35 minutes, with more than 50% of this in face to face time, dedicated to coordination of care, differentiation of the excessive daytime sleepiness being caused by multiple sclerosis, medication, depression or anxiety, and the possibility of contributing obstructive sleep apnea. The very high Epworth sleepiness score also is suspicious for an underlying sleep disorder.    ASSESSMENT AND PLAN 64) 45 y.o. year old female  has a past medical history of multiple sclerosis currently stable on tecfidera. Patient will get a CBC and CMP today. The patient is continued Tecfidera in late October - she  Has not had a CMET in 2016 ? Needs one now. Ordered today  MRI of the brain .  2) patient has witnessed snoring, apneas and a EDS - Epworth 15, Will order a HST to screen for apnea. If positive, will ask patient to undergo an attended sleep study for PAP titration.     Vickie Morrow, [facility] [address], [place], [place] [address] [phone]   CC Eduard Clos, MD

## 2015-03-07 LAB — CBC WITH DIFFERENTIAL/PLATELET
BASOS: 0 %
Basophils Absolute: 0 10*3/uL (ref 0.0–0.2)
EOS (ABSOLUTE): 0.1 10*3/uL (ref 0.0–0.4)
Eos: 1 %
HEMATOCRIT: 37.8 % (ref 34.0–46.6)
Hemoglobin: 12.6 g/dL (ref 11.1–15.9)
IMMATURE GRANULOCYTES: 0 %
Immature Grans (Abs): 0 10*3/uL (ref 0.0–0.1)
LYMPHS ABS: 2.3 10*3/uL (ref 0.7–3.1)
Lymphs: 49 %
MCH: 29 pg (ref 26.6–33.0)
MCHC: 33.3 g/dL (ref 31.5–35.7)
MCV: 87 fL (ref 79–97)
MONOS ABS: 0.2 10*3/uL (ref 0.1–0.9)
Monocytes: 5 %
NEUTROS ABS: 2.2 10*3/uL (ref 1.4–7.0)
NEUTROS PCT: 45 %
Platelets: 253 10*3/uL (ref 150–379)
RBC: 4.35 x10E6/uL (ref 3.77–5.28)
RDW: 14.5 % (ref 12.3–15.4)
WBC: 4.8 10*3/uL (ref 3.4–10.8)

## 2015-03-07 LAB — COMPREHENSIVE METABOLIC PANEL
A/G RATIO: 1.4 (ref 1.1–2.5)
ALBUMIN: 4.2 g/dL (ref 3.5–5.5)
ALT: 26 IU/L (ref 0–32)
AST: 17 IU/L (ref 0–40)
Alkaline Phosphatase: 91 IU/L (ref 39–117)
BUN / CREAT RATIO: 14 (ref 9–23)
BUN: 11 mg/dL (ref 6–24)
Bilirubin Total: 0.2 mg/dL (ref 0.0–1.2)
CO2: 24 mmol/L (ref 18–29)
Calcium: 9.1 mg/dL (ref 8.7–10.2)
Chloride: 100 mmol/L (ref 96–106)
Creatinine, Ser: 0.78 mg/dL (ref 0.57–1.00)
GFR calc Af Amer: 106 mL/min/{1.73_m2} (ref >59)
GFR, EST NON AFRICAN AMERICAN: 92 mL/min/{1.73_m2} (ref >59)
Globulin, Total: 3.1 g/dL (ref 1.5–4.5)
Glucose: 97 mg/dL (ref 65–99)
POTASSIUM: 3.7 mmol/L (ref 3.5–5.2)
Sodium: 140 mmol/L (ref 134–144)
Total Protein: 7.3 g/dL (ref 6.0–8.5)

## 2015-03-10 ENCOUNTER — Telehealth: Payer: Self-pay | Admitting: Neurology

## 2015-03-10 NOTE — Telephone Encounter (Signed)
Called pt back to advise her that HST order was already placed on 03/06/15 by Dr. Brett Fairy and our sleep lab will call her to get that scheduled. I left a detailed message on her cell VM per DPR. Asked her to call our office back with further questions.

## 2015-03-10 NOTE — Telephone Encounter (Signed)
Pt called and would like an order for an at home sleep study. May call pt at [phone]

## 2015-03-12 ENCOUNTER — Telehealth: Payer: Self-pay | Admitting: *Deleted

## 2015-03-12 NOTE — Telephone Encounter (Signed)
-----   Message from Larey Seat, MD sent at 03/11/2015  5:55 PM EST ----- Normal metabolic panel, normal CBC - CD

## 2015-03-12 NOTE — Telephone Encounter (Signed)
Spoke to pt about normal labs per Dr Brett Fairy. Pt verbalized understanding.

## 2015-04-24 ENCOUNTER — Encounter (INDEPENDENT_AMBULATORY_CARE_PROVIDER_SITE_OTHER): Payer: 59 | Admitting: Neurology

## 2015-04-24 DIAGNOSIS — G4733 Obstructive sleep apnea (adult) (pediatric): Secondary | ICD-10-CM

## 2015-04-24 DIAGNOSIS — G4730 Sleep apnea, unspecified: Secondary | ICD-10-CM

## 2015-04-24 DIAGNOSIS — G4719 Other hypersomnia: Secondary | ICD-10-CM

## 2015-04-24 DIAGNOSIS — G35 Multiple sclerosis: Secondary | ICD-10-CM

## 2015-04-24 DIAGNOSIS — R0683 Snoring: Secondary | ICD-10-CM

## 2015-05-01 ENCOUNTER — Telehealth: Payer: Self-pay

## 2015-05-01 NOTE — Telephone Encounter (Signed)
I spoke to pt regarding her sleep study results. I advised her that her sleep study only showed snoring, which can be treated by avoiding sleeping on her back and with a dental device. I advise pt to avoid sleeping on her back. I advised pt to avoid sedative hypnotics which may worsen sleep apnea, alcohol, and tobacco. I advised pt to lose weight, diet, and exercise if not contraindicated by her other physicians. Pt verbalized understanding. Pt has yet to have her MRI completed but she says she will have it done within the week. A follow up appt was made with pt for 5/9 at 3:30 to discuss HST and MRI results. Pt verbalized understanding.

## 2015-06-10 ENCOUNTER — Ambulatory Visit: Payer: Self-pay | Admitting: Neurology

## 2015-06-12 ENCOUNTER — Other Ambulatory Visit: Payer: Self-pay

## 2015-06-16 ENCOUNTER — Ambulatory Visit: Payer: Self-pay | Admitting: Neurology

## 2015-07-14 ENCOUNTER — Telehealth: Payer: Self-pay | Admitting: Neurology

## 2015-07-14 DIAGNOSIS — G35 Multiple sclerosis: Secondary | ICD-10-CM

## 2015-07-14 NOTE — Telephone Encounter (Signed)
Tecfidera has not been ordered for this pt since 04/2014, and it was the starter pack (120 mg BID). Pt has not completed her MRI.  Do you wish to restart pt on tecfidera? Pt's labs were normal in February of 2017.

## 2015-07-14 NOTE — Telephone Encounter (Signed)
Patient is calling for a refill on Tecfidera 120 to 240 mg to be [facility] to [id number], [address], [place]

## 2015-07-15 MED ORDER — DIMETHYL FUMARATE 120 & 240 MG PO MISC: 120.0000 mg | Freq: Two times a day (BID) | ORAL | Status: DC

## 2015-07-15 MED ORDER — DIMETHYL FUMARATE 240 MG PO CPDR: 240.0000 mg | DELAYED_RELEASE_CAPSULE | Freq: Two times a day (BID) | ORAL | Status: DC

## 2015-07-15 NOTE — Telephone Encounter (Signed)
Patient returned your call.  Thanks!

## 2015-07-15 NOTE — Addendum Note (Signed)
Addended by: Larey Seat on: 07/15/2015 05:47 PM   Modules accepted: Orders

## 2015-07-15 NOTE — Telephone Encounter (Signed)
I returned pt's call, no answer, left a message asking her to call me back. 

## 2015-07-15 NOTE — Telephone Encounter (Signed)
I called pt to discuss. She needs an MRI and appt with Dr. Brett Fairy. No answer, left a message asking her to call me back.

## 2015-07-15 NOTE — Telephone Encounter (Signed)
I last saw this patient Feb 2015. Seen by Dohmeier 04/2014 and 03/2015.

## 2015-07-15 NOTE — Telephone Encounter (Signed)
Refill for 240 mg bid and see if MRI makes a payment plan. CD

## 2015-07-15 NOTE — Telephone Encounter (Signed)
I spoke to pt. I advised her that her MRI does not appear to have been completed yet. Pt says that her deductible is "too high this year" so she plans on having her MRI next year. She says that she "completely ran out of her tecfidera last Saturday" but thinks she was taking the 240 mg twice daily.  Does she need to complete her MRI this year? Also, does she need a titration pack or just resume the tecfidera 240 mg BID?

## 2015-07-15 NOTE — Telephone Encounter (Signed)
Vickie Morrow- She needs all tests completed- why has she not contacted [place] for refills ???  She will need a new titration pack. And we will order the refill through MS pathways or whatever organization is helping MS patients.

## 2015-07-16 NOTE — Telephone Encounter (Signed)
I spoke to pt and advised her that the tecifdera was refilled by Dr. Brett Fairy but Dr. Brett Fairy still wants pt to complete her MRI, and has suggested a payment pan. Pt verbalized understanding. I advised her that I would send this message to Andee Poles to help coordinate and schedule pts MRI with a payment plan. Pt verbalized understanding.  [facility] [address] to make sure they could fill the tecfidera but they advised me that they cannot fill it, since it is a specialty drug, [facility] must be [facility]. I advised pt of this and she verbalized understanding.  RX for [facility]ed to [facility]. Received a receipt of confirmation.

## 2015-07-22 NOTE — Telephone Encounter (Signed)
I spoke to pt. A PA for tecfidera is required by pt's insurance. [facility] has been [facility]. We are just waiting on a response. Pt verbalized understanding.

## 2015-07-22 NOTE — Telephone Encounter (Signed)
Pt called said [facility] and was told they are waiting for 2nd response. She doesn't know what the response is. Medication has not shipped and she has been out for 3 weeks. Please call

## 2015-07-23 NOTE — Telephone Encounter (Signed)
PA for tecfidera approved from 07/22/15-07/21/17. PA# [id number] from [facility].

## 2016-02-16 ENCOUNTER — Other Ambulatory Visit: Payer: Self-pay | Admitting: Neurology

## 2016-02-16 NOTE — Telephone Encounter (Signed)
Pt needs an appt for further  refills 

## 2016-03-29 ENCOUNTER — Other Ambulatory Visit: Payer: Self-pay | Admitting: Neurology

## 2016-03-30 ENCOUNTER — Telehealth: Payer: Self-pay | Admitting: Neurology

## 2016-03-30 DIAGNOSIS — G35 Multiple sclerosis: Secondary | ICD-10-CM

## 2016-03-30 NOTE — Telephone Encounter (Signed)
Patient has f/u appt 06/08/16, please place new MRI order

## 2016-03-30 NOTE — Telephone Encounter (Signed)
Pt is ready to schedule MRI. Last order was 03/13/15 but pt did not have it. Does it need a new order or referral?

## 2016-03-31 NOTE — Addendum Note (Signed)
Addended by: Larey Seat on: 03/31/2016 03:28 PM   Modules accepted: Orders

## 2016-03-31 NOTE — Telephone Encounter (Signed)
MS patient , needs cervical spine and BRAIn MRI.

## 2016-04-01 NOTE — Telephone Encounter (Signed)
Left a voicemail on patient phone informing her the MRI order was sent to GI and she can call at anytime to schedule at [phone]

## 2016-04-06 ENCOUNTER — Other Ambulatory Visit: Payer: Self-pay | Admitting: Neurology

## 2016-04-07 ENCOUNTER — Telehealth: Payer: Self-pay | Admitting: Neurology

## 2016-04-07 DIAGNOSIS — G35 Multiple sclerosis: Secondary | ICD-10-CM

## 2016-04-07 NOTE — Telephone Encounter (Signed)
I called pt. I advised her that Dr. Brett Fairy did refill her tecifdera, however, her MRIs need to be completed before her next appt. Prior to her MRIs, she needs to have her blood work checked. I gave clinic hours and asked her to come during clinic hours to have her blood work checked. I also gave her [place] phone number (per telephone note from 03/31/2016) to schedule her MRIs. Pt verbalized understanding.

## 2016-04-07 NOTE — Telephone Encounter (Signed)
Needs to have MRI before RV, MRI brain and neck spine. Please make sure she has a recent lab test ( CMET ).  CD

## 2016-04-07 NOTE — Telephone Encounter (Signed)
Pt has not been seen since 03/2015. Pt does a follow up appt in May on 2018. MRIs have been ordered, not completed yet. Do you want to refill this medication?

## 2016-04-15 ENCOUNTER — Telehealth: Payer: Self-pay | Admitting: Neurology

## 2016-04-15 ENCOUNTER — Other Ambulatory Visit (INDEPENDENT_AMBULATORY_CARE_PROVIDER_SITE_OTHER): Payer: Self-pay

## 2016-04-15 DIAGNOSIS — G35 Multiple sclerosis: Secondary | ICD-10-CM

## 2016-04-15 DIAGNOSIS — Z0289 Encounter for other administrative examinations: Secondary | ICD-10-CM

## 2016-04-15 MED ORDER — ALPRAZOLAM 0.5 MG PO TABS: ORAL_TABLET | ORAL | 0 refills | Status: DC

## 2016-04-15 NOTE — Telephone Encounter (Signed)
Patient called office in reference to see if she is able to receive medication to help calm her down for her MRI on Saturday.  Please call

## 2016-04-15 NOTE — Addendum Note (Signed)
Addended by: Larey Seat on: 04/15/2016 04:30 PM   Modules accepted: Orders

## 2016-04-16 ENCOUNTER — Telehealth: Payer: Self-pay | Admitting: *Deleted

## 2016-04-16 LAB — COMPREHENSIVE METABOLIC PANEL
ALK PHOS: 94 IU/L (ref 39–117)
ALT: 16 IU/L (ref 0–32)
AST: 14 IU/L (ref 0–40)
Albumin/Globulin Ratio: 1.4 (ref 1.2–2.2)
Albumin: 4.4 g/dL (ref 3.5–5.5)
BILIRUBIN TOTAL: 0.2 mg/dL (ref 0.0–1.2)
BUN / CREAT RATIO: 17 (ref 9–23)
BUN: 14 mg/dL (ref 6–24)
CHLORIDE: 100 mmol/L (ref 96–106)
CO2: 23 mmol/L (ref 18–29)
CREATININE: 0.84 mg/dL (ref 0.57–1.00)
Calcium: 9.5 mg/dL (ref 8.7–10.2)
GFR calc Af Amer: 96 mL/min/{1.73_m2} (ref >59)
GFR calc non Af Amer: 84 mL/min/{1.73_m2} (ref >59)
Globulin, Total: 3.2 g/dL (ref 1.5–4.5)
Glucose: 94 mg/dL (ref 65–99)
Potassium: 4 mmol/L (ref 3.5–5.2)
Sodium: 143 mmol/L (ref 134–144)
Total Protein: 7.6 g/dL (ref 6.0–8.5)

## 2016-04-16 NOTE — Telephone Encounter (Signed)
Per Dr Brett Fairy, spoke with patient and informed her that all lab results are normal. She then asked about medication to take prior to MRI; advised her the Rx was faxed to her pharmacy yesterday. She verbalized understanding, appreciation.

## 2016-04-17 ENCOUNTER — Ambulatory Visit
Admission: RE | Admit: 2016-04-17 | Discharge: 2016-04-17 | Disposition: A | Discharge status: Home / Self Care | Payer: 59 | Source: Ambulatory Visit | Attending: Neurology | Admitting: Neurology

## 2016-04-17 DIAGNOSIS — G35 Multiple sclerosis: Secondary | ICD-10-CM | POA: Diagnosis not present

## 2016-04-17 MED ORDER — GADOBENATE DIMEGLUMINE 529 MG/ML IV SOLN
15.0000 mL | Freq: Once | INTRAVENOUS | Status: AC | PRN
  Administered 2016-04-17: 14 mL via INTRAVENOUS

## 2016-04-21 ENCOUNTER — Telehealth: Payer: Self-pay

## 2016-04-21 NOTE — Telephone Encounter (Signed)
-----   Message from Larey Seat, MD sent at 04/20/2016  4:56 PM EDT ----- One new brain lesion was found, but not acute. Normal enhancement pattern for all other regions. Normal brain volume

## 2016-04-21 NOTE — Telephone Encounter (Signed)
-----   Message from Larey Seat, MD sent at 04/20/2016  4:55 PM EDT ----- Degenerative changes have progressed, but there are no normal enhancing lesions that would indicate MS involvement. No acute lesions. 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

## 2016-04-22 NOTE — Telephone Encounter (Signed)
I called pt. I advised her that her MRI cervical spine results showed degenerative changes have progressed but there are no normal enhancing lesions that would indicate MS involvement. There were no acute lesions noted. Mild spinal stenosis and foraminal stenosis noted at the right C6 exit level without clear evidence of nerve compression  I advised her that her MRI brain showed one new lesion but it was not acute, normal enhancement patterns for all other regions, and there was normal brain volume as well.  Pt has a follow up on 06/08/16 and wants to discuss possible medication changes at that appt and these results. Pt says that she may need another HST because she is having problems sleeping. It appears that pt has a balance with [facility], and I advised her of this. Pt was agreeable to being transferred to our billing department to discuss her balance. Pt was advised to leave her name, DOB, and phone number if billing dept does not answer. Pt verbalized understanding of results. Pt had no questions at this time but was encouraged to call back if questions arise.

## 2016-05-14 ENCOUNTER — Encounter: Payer: Self-pay | Admitting: *Deleted

## 2016-05-14 NOTE — Progress Notes (Signed)
[facility] [address] shipped to patient on 05/10/16.

## 2016-05-28 ENCOUNTER — Other Ambulatory Visit: Payer: Self-pay | Admitting: Neurology

## 2016-06-08 ENCOUNTER — Encounter (INDEPENDENT_AMBULATORY_CARE_PROVIDER_SITE_OTHER): Payer: Self-pay

## 2016-06-08 ENCOUNTER — Ambulatory Visit (INDEPENDENT_AMBULATORY_CARE_PROVIDER_SITE_OTHER): Payer: 59 | Admitting: Neurology

## 2016-06-08 ENCOUNTER — Encounter: Payer: Self-pay | Admitting: Neurology

## 2016-06-08 VITALS — BP 173/93 | HR 84 | Ht 63.0 in | Wt 169.0 lb

## 2016-06-08 DIAGNOSIS — R0683 Snoring: Secondary | ICD-10-CM

## 2016-06-08 DIAGNOSIS — G4719 Other hypersomnia: Secondary | ICD-10-CM

## 2016-06-08 DIAGNOSIS — G35 Multiple sclerosis: Secondary | ICD-10-CM | POA: Diagnosis not present

## 2016-06-08 DIAGNOSIS — F519 Sleep disorder not due to a substance or known physiological condition, unspecified: Secondary | ICD-10-CM

## 2016-06-08 DIAGNOSIS — R682 Dry mouth, unspecified: Secondary | ICD-10-CM

## 2016-06-08 DIAGNOSIS — F5109 Other insomnia not due to a substance or known physiological condition: Secondary | ICD-10-CM | POA: Insufficient documentation

## 2016-06-08 MED ORDER — CYCLOBENZAPRINE HCL 10 MG PO TABS
10.0000 mg | ORAL_TABLET | Freq: Every day | ORAL | 3 refills | Status: AC
Start: 2016-06-08 — End: ?

## 2016-06-08 MED ORDER — DIMETHYL FUMARATE 240 MG PO CPDR: DELAYED_RELEASE_CAPSULE | ORAL | 0 refills | Status: DC

## 2016-06-08 NOTE — Progress Notes (Signed)
[facility]  PATIENT: Vickie Morrow DOB: 08-04-1970   REASON FOR VISIT:follow up for MS, tacfidera user.    HISTORY OF PRESENT ILLNESS: Vickie Morrow, 46 year old afro [place] , right handed female returns for follow up and medication monitoring. She is seen today upon request of her gynecologist, Dr. Eduard Clos, for a sleep consultation. She has last been seen in 2016 by Cecille Rubin, NP for tacfidera  refills. Interval history from 06/08/2016, I had ordered a home sleep test for the patient performed in March 2017 which resulted in very mild sleep apnea which would not treated by CPAP. The patient reports that she sometimes hears herself gasping for air and wakes up she rarely sleeps through the night uninterrupted and the home sleep testing device presented a red light which may have meant that it was low on battery power. The total recording time however was 412 minutes and there is no evidence of the recording broke off. She also had normal heart rate no significant desaturation and oxygen. In addition I followed her for her MS chief complaint ; she had undergone in March of this year and MRI of the brain and cervical spine, both with and without contrast. The brain MRI from March 17 was interpreted by my colleague Dr. Felecia Shelling. Which documented chronic demyelinating plaque consistent with her diagnosis of multiple sclerosis none of the foci was acute but one was new within the right frontal lobe and not present in the previous MRI from 10-26-11. The patient also had a cervical spine MRI also from March 17, and this showed hyperintense foci in the spinal cord adjacent to C2 wearing towards the right there are degenerative changes that have started in her cervical spine with a disc bulging at 33 and a disc protruding at C4 but no resulting nerve compression is noted. She will continue on Tecfidera, noted flushing as only side effect. I will prescribe flexaril prn to help her  relax and sleep.       PMHX: She has been followed at [facility] intermittently since December 2005 when she was seen to rule out multiple sclerosis. She has a history of relapsing remitting  multiple sclerosis, diagnosed in the year 2003 by Dr. Jacolyn Reedy. At that time she presented with paresthesias in her arms and fingers. She had an abnormal MRI of the brain and cervical spine which Dr. Jacolyn Reedy felt most certainly likely to be multiple sclerosis. There was a plaque at the C2 level on the right, MRI of the brain showed 2-3 lesions suggestive of MS. She also has a history of hypertension that would place her at risk for stroke, she did have visual evoked responses done and brainstem auditory evoked responses done both of which were normal. Repeat MRI done in 2007 with minimal change, MRI of the cervical spine at that time was felt to be normal. She was advised to have a lumbar puncture to look for oligoclonal bands but she declined at that time. She had a repeat MRI of the brain in August 2009 with no change in the 3 white matter lesions previously seen in the prior study of 2007. Patient still declined a lumbar puncture, and would like to get to every 2 years on her MRI due to cost and no changes. She continues to complain of fatigue, recently been on some blood pressure medications, significant constipation and occasional difficulty sleeping. She has very rare paresthesias. Review of systems is otherwise negative.  The patient has been stable for the last  4 years and a new MRI for check up of silent progression is recommended. Never had a CSF test for oligoclonal bands. She is unsure of how often a stable patient should follow up, concerned that the diagnosis is not correct.  MRI positive for one new lesion -12-08-11 and recommend to have CSF testing. On 02-10-2012.Patient learned about the MRI and CSF results, positive for 5 or more oligoclonal bands. She has been told to read up on MS , and reading material was  given to her on Dec 31 st - for injectable , oral and iv treatments by Dr. Brett Fairy.  She is fatigued and has myalgias, feels depressed often, She wanted to take this into consideration. She is aware of the daily versus twcie daily oral medications and their side effects. She decided onTecfidera.  She was last seen in this office 6 month ago by myself.   She is currently on tecfidera without side effects. She denies any spasms, focal weakness, visual changes, speech or swallowing problems,  bowel or bladder dysfunction, she has not fallen.She returns for reevaluation of the Tacfidera effect, has no longer flushing or nausea and tolerated the medication well after 21 days and when taken with food. She has no numbness except for the left face .   Sleep consultation from the second Feb. 2017, Cecille Rubin , NP  Vickie Morrow reports that she is sleepy and fatigued, both symptoms could be attributed to MS but at least her sleepiness has exacerbated over the last year according to her referring physician's note. The patient has been told that she snores, and she has been noted to hold her breath de facto having apneas. Her bedtime is usually around 8:30 PM and she will fall asleep within 5 minutes. She may Wake up once or twice at night sometimes to go to the bathroom, and she will sleep again until she  wakes up by stopping to breathe or gasping for air. She prefers to fall asleep on the side but sometimes wakes up on her back. 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 She works from 8 AM to 5 PM and sometimes struggles at work with staying awake. If she has the irresistible urge to go to sleep she will walk for a while, usually she gets sleepier if she is not physically active or not mentally challenged or stimulated. Once she is at home she feels ready to take a nap, with nap may last 2-3 hours before her real  bedtime begins. She still feels not necessarily rested and refreshed and remains able to fall asleep at night. She describes hypersomnia. Her bedroom at home is cool, quiet and dark. She lives alone. If she naps she is usually in bed also. She has not found 30 minute naps to be helpful on the contrary she craves more sleep. In the morning she feels somewhat restored but this feeling doesn't last for long. She wakes with a dry mouth but not headaches. She does not use any coffee in any form, she does not use tobacco, alcohol use may be on the weekends but not routinely. 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 She has been diagnosed with MS in 2003.    REVIEW OF SYSTEMS: Full 14 system review of systems performed and notable only for those listed, all others are neg:  How likely are you to doze in the  following situations: 0 = not likely, 1 = slight chance, 2 = moderate chance, 3 = high chance  Sitting and Reading?2 Watching Television?3 Sitting inactive in a public place (theater or meeting)?2 Lying down in the afternoon when circumstances permit?2 Sitting and talking to someone?1 Sitting quietly after lunch without alcohol?0 In a car, while stopped for a few minutes in traffic?0 As a passenger in a car for an hour without a break?2  Total = 12  Allergy/Immunology: MS Neurological: MS related fatigue and numbness. Psychiatric: anxiety    ALLERGIES: No Known Allergies  HOME MEDICATIONS: Outpatient Medications Prior to Visit  Medication Sig Dispense Refill  . Biotin 5 MG TABS Take 5 mg by mouth daily.    . Multiple Vitamins-Minerals (HAIR VITAMINS PO) Take 2 capsules by mouth daily.    . TECFIDERA 240 MG CPDR TAKE 1 CAPSULE BY MOUTH TWICE A DAY (EVERY 12 HOURS) SWALLOW WHOLE DONOT OPEN, BREAK, CRUSH OR CHEW. 60 capsule 0  . ALPRAZolam (XANAX) 0.5 MG tablet Use 1 tab 30 minutes prior to MRI, and again at time of test. 16 tablet 0   No facility-administered medications prior to visit.     PAST  MEDICAL HISTORY: Past Medical History:  Diagnosis Date  . High blood pressure   . High cholesterol   . MS (multiple sclerosis) ([place])   . Tubal pregnancy     PAST SURGICAL HISTORY: Past Surgical History:  Procedure Laterality Date  . Feet Surgery  2006    FAMILY HISTORY: Family History  Problem Relation Age of Onset  . Stroke Maternal Grandmother   . Prostate cancer Father   . Alzheimer's disease Paternal Grandmother   . Cancer Maternal Grandfather     Pancreatic     SOCIAL HISTORY: Social History   Social History  . Marital status: Single    Spouse name: N/A  . Number of children: 0  . Years of education: College   Occupational History  . Business Analyst   .  [employer]   Social History Main Topics  . Smoking status: Never Smoker  . Smokeless tobacco: Never Used  . Alcohol use 0.5 - 1.0 oz/week    1 - 2 drink(s) per week  . Drug use: No  . Sexual activity: Not on file   Other Topics Concern  . Not on file   Social History Narrative   Patient lives at home.    Patient is a Scientist, forensic.    Patient has never been married.    Patient has no children.    Patient has a college education.   Patient is right-handed.   Patient does not drink any caffeine.           PHYSICAL EXAM  Vitals:   06/08/16 1321  BP: (!) 173/93  Pulse: 84  Weight: 169 lb (76.7 kg)  Height: 5\' 3"  (1.6 m)   Body mass index is 29.94 kg/m.   The patient is a slightly elevated body mass index, mild retrognathia, Mallampati grade 3, neck circumference 15 inches, Unrestricted nasal airflow. She does have a TMJ click.  Generalized: Well developed, in no acute distress  Head: normocephalic and atraumatic,. Oropharynx benign  Neck: Supple, no carotid bruits  Cardiac: Regular rate rhythm, no murmur  Musculoskeletal: No deformity.  Neurological examination Mentation: Alert oriented to time, place, history taking. Follows all commands speech and language fluent. Well  groomed.   Cranial nerve , she has normal smell or taste sensation.  No  olfactory or gustatory hallucinations. normal eye movements.  Pupils were equal round reactive to light extraocular movements were full, visual field were full on confrontational test.  Visual acuity 20/50 left, 2040 right. Facial sensation : left face feelt numb in the  lower face only - this has resolved.   Facial strength is normal.  Her hearing was intact to finger rubbing bilaterally.  Uvula and tongue midline. Head turning and shoulder shrug were normal and symmetric.  DIAGNOSTIC DATA (LABS, IMAGING, TESTING)   Lab Results  Component Value Date   WBC 4.8 03/06/2015   HGB 13.1 10/10/2013   HCT 37.8 03/06/2015   MCV 87 03/06/2015   PLT 253 03/06/2015      Component Value Date/Time   NA 143 04/15/2016 1348   K 4.0 04/15/2016 1348   CL 100 04/15/2016 1348   CO2 23 04/15/2016 1348   GLUCOSE 94 04/15/2016 1348   BUN 14 04/15/2016 1348   CREATININE 0.84 04/15/2016 1348   CALCIUM 9.5 04/15/2016 1348   PROT 7.6 04/15/2016 1348   ALBUMIN 4.4 04/15/2016 1348   AST 14 04/15/2016 1348   ALT 16 04/15/2016 1348   ALKPHOS 94 04/15/2016 1348   BILITOT 0.2 04/15/2016 1348   GFRNONAA 84 04/15/2016 1348   GFRAA 96 04/15/2016 1348    This  visit on an established MS patient referred for a sleep consultation took 35 minutes, with more than 50% of this in face to face time, dedicated to coordination of care, differentiation of the excessive daytime sleepiness being caused by multiple sclerosis, medication, depression or anxiety, and the possibility of contributing obstructive sleep apnea. The very high Epworth sleepiness score also is suspicious for an underlying sleep disorder.    ASSESSMENT AND PLAN 35) 46 y.o. year old female  has a past medical history of multiple sclerosis currently stable on tecfidera. Continue medication  Patient will get a CBC and CMP today.   MRI of the brain and cervical spine were with  little progression. DDD is new .Marland Kitchen  2) patient has witnessed snoring, apneas and a EDS - Epworth 15, now 12 but no apnea on HST. The patient feels that she sometimes struggles for air seems to choke on air and that she is a very restless sleeper. I felt good about the quality of the home sleep test but a home sleep test has more error potential than an attended sleep study. I would offer to repeat the home sleep test and if the results do not correspond to subjective experience at night, I may ask her to come for an attended sleep study.   Tecfidera to continue, take with applesauce. Flexaril for neck spine tenderness and  Tension. Helps with sleep. HST repeat.    Annie Roseboom, [facility] [address], [place], [place] [address] [phone]   CC Eduard Clos, MD

## 2016-06-08 NOTE — Patient Instructions (Signed)
Cyclobenzaprine tablets What is this medicine? CYCLOBENZAPRINE (sye kloe BEN za preen) is a muscle relaxer. It is used to treat muscle pain, spasms, and stiffness. This medicine may be used for other purposes; ask your health care provider or pharmacist if you have questions. COMMON BRAND NAME(S): Fexmid, Flexeril What should I tell my health care provider before I take this medicine? They need to know if you have any of these conditions: -heart disease, irregular heartbeat, or previous heart attack -liver disease -thyroid problem -an unusual or allergic reaction to cyclobenzaprine, tricyclic antidepressants, lactose, other medicines, foods, dyes, or preservatives -pregnant or trying to get pregnant -breast-feeding How should I use this medicine? Take this medicine by mouth with a glass of water. Follow the directions on the prescription label. If this medicine upsets your stomach, take it with food or milk. Take your medicine at regular intervals. Do not take it more often than directed. Talk to your pediatrician regarding the use of this medicine in children. Special care may be needed. Overdosage: If you think you have taken too much of this medicine contact a poison control center or emergency room at once. NOTE: This medicine is only for you. Do not share this medicine with others. What if I miss a dose? If you miss a dose, take it as soon as you can. If it is almost time for your next dose, take only that dose. Do not take double or extra doses. What may interact with this medicine? Do not take this medicine with any of the following medications: -certain medicines for fungal infections like fluconazole, itraconazole, ketoconazole, posaconazole, voriconazole -cisapride -dofetilide -dronedarone -halofantrine -levomethadyl -MAOIs like Carbex, Eldepryl, Marplan, Nardil, and Parnate -narcotic medicines for cough -pimozide -thioridazine -ziprasidone This medicine may also interact  with the following medications: -alcohol -antihistamines for allergy, cough and cold -certain medicines for anxiety or sleep -certain medicines for cancer -certain medicines for depression like amitriptyline, fluoxetine, sertraline -certain medicines for infection like alfuzosin, chloroquine, clarithromycin, levofloxacin, mefloquine, pentamidine, troleandomycin -certain medicines for irregular heart beat -certain medicines for seizures like phenobarbital, primidone -contrast dyes -general anesthetics like halothane, isoflurane, methoxyflurane, propofol -local anesthetics like lidocaine, pramoxine, tetracaine -medicines that relax muscles for surgery -narcotic medicines for pain -other medicines that prolong the QT interval (cause an abnormal heart rhythm) -phenothiazines like chlorpromazine, mesoridazine, prochlorperazine This list may not describe all possible interactions. Give your health care provider a list of all the medicines, herbs, non-prescription drugs, or dietary supplements you use. Also tell them if you smoke, drink alcohol, or use illegal drugs. Some items may interact with your medicine. What should I watch for while using this medicine? Tell your doctor or health care professional if your symptoms do not start to get better or if they get worse. You may get drowsy or dizzy. Do not drive, use machinery, or do anything that needs mental alertness until you know how this medicine affects you. Do not stand or sit up quickly, especially if you are an older patient. This reduces the risk of dizzy or fainting spells. Alcohol may interfere with the effect of this medicine. Avoid alcoholic drinks. If you are taking another medicine that also causes drowsiness, you may have more side effects. Give your health care provider a list of all medicines you use. Your doctor will tell you how much medicine to take. Do not take more medicine than directed. Call emergency for help if you have  problems breathing or unusual sleepiness. Your mouth may get dry. Chewing   sugarless gum or sucking hard candy, and drinking plenty of water may help. Contact your doctor if the problem does not go away or is severe. What side effects may I notice from receiving this medicine? Side effects that you should report to your doctor or health care professional as soon as possible: -allergic reactions like skin rash, itching or hives, swelling of the face, lips, or tongue -breathing problems -chest pain -fast, irregular heartbeat -hallucinations -seizures -unusually weak or tired Side effects that usually do not require medical attention (report to your doctor or health care professional if they continue or are bothersome): -headache -nausea, vomiting This list may not describe all possible side effects. Call your doctor for medical advice about side effects. You may report side effects to FDA at 1-800-FDA-1088. Where should I keep my medicine? Keep out of the reach of children. Store at room temperature between 15 and 30 degrees C (59 and 86 degrees F). Keep container tightly closed. Throw away any unused medicine after the expiration date. NOTE: This sheet is a summary. It may not cover all possible information. If you have questions about this medicine, talk to your doctor, pharmacist, or health care provider.  2018 Elsevier/Gold Standard (2014-10-29 12:05:46)  

## 2016-06-29 ENCOUNTER — Other Ambulatory Visit: Payer: Self-pay | Admitting: Neurology

## 2016-07-14 ENCOUNTER — Telehealth: Payer: Self-pay

## 2016-07-14 ENCOUNTER — Ambulatory Visit (INDEPENDENT_AMBULATORY_CARE_PROVIDER_SITE_OTHER): Payer: 59 | Admitting: Neurology

## 2016-07-14 DIAGNOSIS — G4710 Hypersomnia, unspecified: Secondary | ICD-10-CM | POA: Diagnosis not present

## 2016-07-14 DIAGNOSIS — F5109 Other insomnia not due to a substance or known physiological condition: Secondary | ICD-10-CM

## 2016-07-14 DIAGNOSIS — F519 Sleep disorder not due to a substance or known physiological condition, unspecified: Secondary | ICD-10-CM

## 2016-07-14 DIAGNOSIS — R682 Dry mouth, unspecified: Secondary | ICD-10-CM

## 2016-07-14 DIAGNOSIS — R0683 Snoring: Secondary | ICD-10-CM

## 2016-07-14 DIAGNOSIS — G4719 Other hypersomnia: Secondary | ICD-10-CM

## 2016-07-14 NOTE — Telephone Encounter (Signed)
I called pt. She has not completed her HST. Unless she is having problems with her MS. Pt picked up the HST today and will bring it back tomorrow. Pt declined following up with Jinny Blossom, NP tomorrow, says "I'm fine". Appt cancelled. Pt knows that I will call her for HST results and we can make a follow up appt at that time.

## 2016-07-15 ENCOUNTER — Ambulatory Visit: Payer: 59 | Admitting: Adult Health

## 2016-07-28 ENCOUNTER — Telehealth: Payer: Self-pay | Admitting: *Deleted

## 2016-07-28 NOTE — Telephone Encounter (Signed)
Received fax confirmation that [facility] shipped tecfidera to pt.

## 2016-07-30 NOTE — Procedures (Signed)
[facility] [address] [place], [place] [address] NAME: Vickie Morrow     DOB: 05-Aug-1970 MEDICAL RECORD [id number]    DOS: 07/15/16 REFERRING PHYSICIAN: Servando Salina, MD  STUDY PERFORMED: HST  HISTORY: Ms.Manni, 46 year old female followed for MS, continues to present with hypersomnia and snoring.  A home sleep test performed in March 2017 resulted in very mild sleep apnea which would not treated by CPAP. The patient reports that she sometimes hears herself snoring and gasping for air and wakes up. she rarely sleeps through the night uninterrupted .The total recording time however was 412 minutes and there is no evidence of the recording broke off. Will repeat .    Epworth Sleepiness Score Total = 12 points, BMI : 30.  STUDY RESULTS: Total Recording Time: 6h 50min.  ; Total Apnea/Hypopnea Index (AHI): 4.0/ hr. RDI was 7.3 /hr. Average Oxygen Saturation: SpO2 of 96% Lowest Oxygen Saturation: Nadir SpO2 at 88%, total time in desaturation 0 .0 minute Average Heart Rate: 71 bpm  IMPRESSION: No significant degree of OSA present, but mild snoring.  RECOMMENDATION: consider snoring therapy with dental device, weight loss and avoiding supine sleep position. CPAP is not indicated. I certify that I have reviewed the raw data recording prior to the issuance of this report in accordance with the standards of the American Academy of Sleep Medicine (AASM). Larey Seat, MD  07-29-2016  [facility], [place] and AASM

## 2016-08-02 ENCOUNTER — Telehealth: Payer: Self-pay

## 2016-08-02 DIAGNOSIS — R0683 Snoring: Secondary | ICD-10-CM

## 2016-08-02 NOTE — Telephone Encounter (Signed)
Left message for patient regarding her sleep study results. Ask to call me back. I let her know that she did not have sleep apnea.

## 2016-08-02 NOTE — Telephone Encounter (Signed)
Patient called back. She would like a referral to her dentist for a snoring device. Dr. Gerlene Burdock on [address]

## 2016-08-02 NOTE — Telephone Encounter (Signed)
The patient is interested in seeing a sleep specialist dentist, Dr. Gerlene Burdock on [address]. Referral will be sent.

## 2016-08-02 NOTE — Addendum Note (Signed)
Addended by: Larey Seat on: 08/02/2016 01:37 PM   Modules accepted: Orders

## 2016-09-24 ENCOUNTER — Other Ambulatory Visit: Payer: Self-pay | Admitting: Neurology

## 2018-02-01 HISTORY — PX: BREAST EXCISIONAL BIOPSY: SUR124

## 2018-02-03 ENCOUNTER — Other Ambulatory Visit: Payer: Self-pay | Admitting: Obstetrics and Gynecology

## 2018-02-03 DIAGNOSIS — N6320 Unspecified lump in the left breast, unspecified quadrant: Secondary | ICD-10-CM

## 2018-02-09 ENCOUNTER — Ambulatory Visit
Admission: RE | Admit: 2018-02-09 | Discharge: 2018-02-09 | Disposition: A | Discharge status: Home / Self Care | Payer: 59 | Source: Ambulatory Visit | Attending: Obstetrics and Gynecology | Admitting: Obstetrics and Gynecology

## 2018-02-09 ENCOUNTER — Other Ambulatory Visit: Payer: Self-pay | Admitting: Obstetrics and Gynecology

## 2018-02-09 DIAGNOSIS — N6320 Unspecified lump in the left breast, unspecified quadrant: Secondary | ICD-10-CM

## 2018-02-10 ENCOUNTER — Other Ambulatory Visit: Payer: 59

## 2018-02-13 ENCOUNTER — Other Ambulatory Visit: Payer: 59

## 2018-02-14 ENCOUNTER — Ambulatory Visit
Admission: RE | Admit: 2018-02-14 | Discharge: 2018-02-14 | Disposition: A | Discharge status: Home / Self Care | Payer: 59 | Source: Ambulatory Visit | Attending: Obstetrics and Gynecology | Admitting: Obstetrics and Gynecology

## 2018-02-14 DIAGNOSIS — N6320 Unspecified lump in the left breast, unspecified quadrant: Secondary | ICD-10-CM

## 2018-02-24 ENCOUNTER — Ambulatory Visit: Payer: Self-pay | Admitting: Surgery

## 2018-02-24 DIAGNOSIS — D242 Benign neoplasm of left breast: Secondary | ICD-10-CM

## 2018-02-24 NOTE — H&P (View-Only) (Signed)
Vickie Morrow Documented: 02/24/2018 9:28 AM Location: [facility] Patient #: [id number] DOB: 1970-09-11 Single / Language: Vickie Morrow / Race: Black or African American Female  History of Present Illness Vickie Moores A. Aneshia Jacquet MD; 02/24/2018 9:47 AM) Patient words: Patient sent at the request of Dr. Theda Morrow of the [facility] secondary to left breast mass and left nipple discharge 3 months. The patient noticed swelling in her left breast lower inner quadrant. She had cleared discharge for many years but the area of swelling became worse. Workup revealed what appeared to be an intraductal papilloma core biopsy confirmed that. She is sent requested Dr. Theda Morrow for left breast papilloma. She has no other complaints today.    Diagnosis Breast, left, needle core biopsy, 7 o'clock, 2cmfn - INTRADUCTAL PAPILLOMA, HYALINIZED.         CLINICAL DATA: 48 year old female presenting for evaluation of a palpable lump in the left breast which she states has been present for years, however has become harder recently. She also has had several years of clear spontaneous left nipple discharge which persists and is unchanged.  EXAM: DIGITAL DIAGNOSTIC LEFT MAMMOGRAM WITH CAD AND TOMO  ULTRASOUND LEFT BREAST  COMPARISON: Previous exam(s).  ACR Breast Density Category b: There are scattered areas of fibroglandular density.  FINDINGS: A BB has been placed on the lower inner aspect of the left breast at the palpable site of concern. Deep to the palpable marker there is an irregular mass measuring approximately 1 cm. No other suspicious calcifications, masses or areas of distortion are seen in the left breast.  Mammographic images were processed with CAD.  On physical exam, there is a firm mass near the nipple in the lower inner left breast. Clear fluid could be manually expressed from a single duct in the slightly lower inner left nipple.  Targeted ultrasound is  performed, showing a hypoechoic intraductal mass measuring approximately 1.3 x 0.3 cm. Images of the left axilla demonstrates multiple normal-appearing lymph nodes.  IMPRESSION: 1. There is an intraductal mass in the lower-inner left breast at 7 o'clock.  2. No evidence of left axillary lymphadenopathy.  RECOMMENDATION: Ultrasound guided biopsy is recommended for the palpable left breast mass. This has been scheduled for 02/14/2018 at 3:45 p.m.  I have discussed the findings and recommendations with the patient. Results were also provided in writing at the conclusion of the visit. If applicable, a reminder letter will be sent to the patient regarding the next appointment.  BI-RADS CATEGORY 4: Suspicious.   Electronically Signed By: Vickie Morrow M.D. On: 02/09/2018 11:06.  The patient is a 48 year old female.   Past Surgical History Vickie Morrow, CMA; 02/24/2018 9:28 AM) Foot Surgery Bilateral.  Diagnostic Studies History Vickie Morrow, CMA; 02/24/2018 9:28 AM) Colonoscopy never Mammogram within last year Pap Smear 1-5 years ago  Allergies Vickie Morrow, CMA; 02/24/2018 9:28 AM) No Known Drug Allergies [02/24/2018]:  Medication History Vickie Morrow, CMA; 02/24/2018 9:29 AM) Vitamin D3 (Oral) Specific strength unknown - Active. Tecfidera (240MG  Capsule DR, Oral) Active.  Pregnancy / Birth History Vickie Morrow, CMA; 02/24/2018 9:28 AM) Age at menarche 23 years. Age of menopause 25-50 Gravida 0 Irregular periods Para 0  Other Problems Vickie Morrow, CMA; 02/24/2018 9:28 AM) High blood pressure Hypercholesterolemia     Review of Systems (Vickie Bega A. Rachyl Wuebker MD; 02/24/2018 9:48 AM) General Not Present- Appetite Loss, Chills, Fatigue, Fever, Night Sweats, Weight Gain and Weight Loss. Skin Not Present- Change in Wart/Mole, Dryness, Hives,  Jaundice, New Lesions, Non-Healing Wounds, Rash and Ulcer. HEENT Not  Present- Earache, Hearing Loss, Hoarseness, Nose Bleed, Oral Ulcers, Ringing in the Ears, Seasonal Allergies, Sinus Pain, Sore Throat, Visual Disturbances, Wears glasses/contact lenses and Yellow Eyes. Breast Present- Breast Mass and Nipple Discharge. Not Present- Breast Pain and Skin Changes. Gastrointestinal Present- Bloating. Not Present- Abdominal Pain, Bloody Stool, Change in Bowel Habits, Chronic diarrhea, Constipation, Difficulty Swallowing, Excessive gas, Gets full quickly at meals, Hemorrhoids, Indigestion, Nausea, Rectal Pain and Vomiting. Female Genitourinary Present- Nocturia. Not Present- Frequency, Painful Urination, Pelvic Pain and Urgency. Neurological Not Present- Decreased Memory, Fainting, Headaches, Numbness, Seizures, Tingling, Tremor, Trouble walking and Weakness. Psychiatric Not Present- Anxiety, Bipolar, Change in Sleep Pattern, Depression, Fearful and Frequent crying. Hematology Not Present- Blood Thinners, Easy Bruising, Excessive bleeding, Gland problems, HIV and Persistent Infections. All other systems negative  Vitals Vickie Morrow CMA; 02/24/2018 9:28 AM) 02/24/2018 9:28 AM Weight: 178.38 lb Height: 63.5in Body Surface Area: 1.85 m Body Mass Index: 31.1 kg/m  Pulse: 109 (Regular)  BP: 132/86 (Sitting, Left Arm, Standard)      Physical Exam (Vickie Heizer A. Annmargaret Decaprio MD; 02/24/2018 9:47 AM)  General Mental Status-Alert. General Appearance-Consistent with stated age. Hydration-Well hydrated. Voice-Normal.  Head and Neck Head-normocephalic, atraumatic with no lesions or palpable masses. Trachea-midline. Thyroid Gland Characteristics - normal size and consistency.  Chest and Lung Exam Chest and lung exam reveals -quiet, even and easy respiratory effort with no use of accessory muscles and on auscultation, normal breath sounds, no adventitious sounds and normal vocal resonance. Inspection Chest Wall - Normal. Back -  normal.  Breast Breast - Left-Symmetric, Non Tender, No Biopsy scars, no Dimpling, No Inflammation, No Lumpectomy scars, No Mastectomy scars, No Peau d' Orange. Breast - Right-Symmetric, Non Tender, No Biopsy scars, no Dimpling, No Inflammation, No Lumpectomy scars, No Mastectomy scars, No Peau d' Orange. Breast Lump-No Palpable Breast Mass.  Cardiovascular Cardiovascular examination reveals -normal heart sounds, regular rate and rhythm with no murmurs and normal pedal pulses bilaterally.  Neurologic Neurologic evaluation reveals -alert and oriented x 3 with no impairment of recent or remote memory. Mental Status-Normal.  Musculoskeletal Normal Exam - Left-Upper Extremity Strength Normal and Lower Extremity Strength Normal. Normal Exam - Right-Upper Extremity Strength Normal and Lower Extremity Strength Normal.  Lymphatic Head & Neck  General Head & Neck Lymphatics: Bilateral - Description - Normal. Axillary  General Axillary Region: Bilateral - Description - Normal. Tenderness - Non Tender.    Assessment & Plan (Carmelina Balducci A. Fauna Neuner MD; 02/24/2018 9:46 AM)  INTRADUCTAL PAPILLOMA OF BREAST, LEFT (D24.2) Impression: Discussion discussed options of excision versus observation. Discussed potential upgrade risk of malignancy of malignancy anywhere from 0-20%. She has chosen left breast lumpectomy. Risk of lumpectomy include bleeding, infection, seroma, more surgery, use of seed/wire, wound care, cosmetic deformity and the need for other treatments, death , blood clots, death. Pt agrees to proceed.  Current Plans You are being scheduled for surgery- Our schedulers will call you.  You should hear from our office's scheduling department within 5 working days about the location, date, and time of surgery. We try to make accommodations for patient's preferences in scheduling surgery, but sometimes the OR schedule or the surgeon's schedule prevents [place] from making those  accommodations.  If you have not heard from our office [phone]) in 5 working days, call the office and ask for your surgeon's nurse.  If you have other questions about your diagnosis, plan, or surgery, call the office and ask for  your surgeon's nurse.  Pt Education - [facility] Breast Biopsy HCI: discussed with patient and provided information.

## 2018-02-24 NOTE — H&P (Signed)
Vickie Morrow Documented: 02/24/2018 9:28 AM Location: [facility] Patient #: [id number] DOB: 1970/09/20 Single / Language: [name] / Race: Black or African American Female  History of Present Illness Vickie Morrow A. Vickie Hine Morrow; 02/24/2018 9:47 AM) Patient words: Patient sent at the request of Dr. Theda Morrow of the Breast Ctr[facility] secondary to left breast mass and left nipple discharge 3 months. The patient noticed swelling in her left breast lower inner quadrant. She had cleared discharge for many years but the area of swelling became worse. Workup revealed what appeared to be an intraductal papilloma core biopsy confirmed that. She is sent requested Dr. Theda Morrow for left breast papilloma. She has no other complaints today.    Diagnosis Breast, left, needle core biopsy, 7 o'clock, 2cmfn - INTRADUCTAL PAPILLOMA, HYALINIZED.         CLINICAL DATA: 48 year old female presenting for evaluation of a palpable lump in the left breast which she states has been present for years, however has become harder recently. She also has had several years of clear spontaneous left nipple discharge which persists and is unchanged.  EXAM: DIGITAL DIAGNOSTIC LEFT MAMMOGRAM WITH CAD AND TOMO  ULTRASOUND LEFT BREAST  COMPARISON: Previous exam(s).  ACR Breast Density Category b: There are scattered areas of fibroglandular density.  FINDINGS: A BB has been placed on the lower inner aspect of the left breast at the palpable site of concern. Deep to the palpable marker there is an irregular mass measuring approximately 1 cm. No other suspicious calcifications, masses or areas of distortion are seen in the left breast.  Mammographic images were processed with CAD.  On physical exam, there is a firm mass near the nipple in the lower inner left breast. Clear fluid could be manually expressed from a single duct in the slightly lower inner left nipple.  Targeted ultrasound is  performed, showing a hypoechoic intraductal mass measuring approximately 1.3 x 0.3 cm. Images of the left axilla demonstrates multiple normal-appearing lymph nodes.  IMPRESSION: 1. There is an intraductal mass in the lower-inner left breast at 7 o'clock.  2. No evidence of left axillary lymphadenopathy.  RECOMMENDATION: Ultrasound guided biopsy is recommended for the palpable left breast mass. This has been scheduled for 02/14/2018 at 3:45 p.m.  I have discussed the findings and recommendations with the patient. Results were also provided in writing at the conclusion of the visit. If applicable, a reminder letter will be sent to the patient regarding the next appointment.  BI-RADS CATEGORY 4: Suspicious.   Electronically Signed By: Vickie Morrow M.D. On: 02/09/2018 11:06.  The patient is a 48 year old female.   Past Surgical History Vickie Morrow, CMA; 02/24/2018 9:28 AM) Foot Surgery Bilateral.  Diagnostic Studies History Vickie Lull R. Vickie Morrow, CMA; 02/24/2018 9:28 AM) Colonoscopy never Mammogram within last year Pap Smear 1-5 years ago  Allergies Vickie Morrow, CMA; 02/24/2018 9:28 AM) No Known Drug Allergies [02/24/2018]:  Medication History Vickie Morrow, CMA; 02/24/2018 9:29 AM) Vitamin D3 (Oral) Specific strength unknown - Active. Tecfidera (240MG  Capsule DR, Oral) Active.  Pregnancy / Birth History Vickie Lull R. Vickie Morrow, CMA; 02/24/2018 9:28 AM) Age at menarche 19 years. Age of menopause 97-50 Gravida 0 Irregular periods Para 0  Other Problems Vickie Morrow, CMA; 02/24/2018 9:28 AM) High blood pressure Hypercholesterolemia     Review of Systems (Vickie Morrow; 02/24/2018 9:48 AM) General Not Present- Appetite Loss, Chills, Fatigue, Fever, Night Sweats, Weight Gain and Weight Loss. Skin Not Present- Change in Wart/Mole, Dryness, Hives,  Jaundice, New Lesions, Non-Healing Wounds, Rash and Ulcer. HEENT Not  Present- Earache, Hearing Loss, Hoarseness, Nose Bleed, Oral Ulcers, Ringing in the Ears, Seasonal Allergies, Sinus Pain, Sore Throat, Visual Disturbances, Wears glasses/contact lenses and Yellow Eyes. Breast Present- Breast Mass and Nipple Discharge. Not Present- Breast Pain and Skin Changes. Gastrointestinal Present- Bloating. Not Present- Abdominal Pain, Bloody Stool, Change in Bowel Habits, Chronic diarrhea, Constipation, Difficulty Swallowing, Excessive gas, Gets full quickly at meals, Hemorrhoids, Indigestion, Nausea, Rectal Pain and Vomiting. Female Genitourinary Present- Nocturia. Not Present- Frequency, Painful Urination, Pelvic Pain and Urgency. Neurological Not Present- Decreased Memory, Fainting, Headaches, Numbness, Seizures, Tingling, Tremor, Trouble walking and Weakness. Psychiatric Not Present- Anxiety, Bipolar, Change in Sleep Pattern, Depression, Fearful and Frequent crying. Hematology Not Present- Blood Thinners, Easy Bruising, Excessive bleeding, Gland problems, HIV and Persistent Infections. All other systems negative  Vitals [employer] R. Morrow CMA; 02/24/2018 9:28 AM) 02/24/2018 9:28 AM Weight: 178.38 lb Height: 63.5in Body Surface Area: 1.85 m Body Mass Index: 31.1 kg/m  Pulse: 109 (Regular)  BP: 132/86 (Sitting, Left Arm, Standard)      Physical Exam (Vickie Pekar A. Keiyana Stehr Morrow; 02/24/2018 9:47 AM)  General Mental Status-Alert. General Appearance-Consistent with stated age. Hydration-Well hydrated. Voice-Normal.  Head and Neck Head-normocephalic, atraumatic with no lesions or palpable masses. Trachea-midline. Thyroid Gland Characteristics - normal size and consistency.  Chest and Lung Exam Chest and lung exam reveals -quiet, even and easy respiratory effort with no use of accessory muscles and on auscultation, normal breath sounds, no adventitious sounds and normal vocal resonance. Inspection Chest Wall - Normal. Back -  normal.  Breast Breast - Left-Symmetric, Non Tender, No Biopsy scars, no Dimpling, No Inflammation, No Lumpectomy scars, No Mastectomy scars, No Peau d' Orange. Breast - Right-Symmetric, Non Tender, No Biopsy scars, no Dimpling, No Inflammation, No Lumpectomy scars, No Mastectomy scars, No Peau d' Orange. Breast Lump-No Palpable Breast Mass.  Cardiovascular Cardiovascular examination reveals -normal heart sounds, regular rate and rhythm with no murmurs and normal pedal pulses bilaterally.  Neurologic Neurologic evaluation reveals -alert and oriented x 3 with no impairment of recent or remote memory. Mental Status-Normal.  Musculoskeletal Normal Exam - Left-Upper Extremity Strength Normal and Lower Extremity Strength Normal. Normal Exam - Right-Upper Extremity Strength Normal and Lower Extremity Strength Normal.  Lymphatic Head & Neck  General Head & Neck Lymphatics: Bilateral - Description - Normal. Axillary  General Axillary Region: Bilateral - Description - Normal. Tenderness - Non Tender.    Assessment & Plan (Margareth Kanner A. Erleen Egner Morrow; 02/24/2018 9:46 AM)  INTRADUCTAL PAPILLOMA OF BREAST, LEFT (D24.2) Impression: Discussion discussed options of excision versus observation. Discussed potential upgrade risk of malignancy of malignancy anywhere from 0-20%. She has chosen left breast lumpectomy. Risk of lumpectomy include bleeding, infection, seroma, more surgery, use of seed/wire, wound care, cosmetic deformity and the need for other treatments, death , blood clots, death. Pt agrees to proceed.  Current Plans You are being scheduled for surgery- Our schedulers will call you.  You should hear from our office's scheduling department within 5 working days about the location, date, and time of surgery. We try to make accommodations for patient's preferences in scheduling surgery, but sometimes the OR schedule or the surgeon's schedule prevents [place] from making those  accommodations.  If you have not heard from our office [phone]) in 5 working days, call the office and ask for your surgeon's nurse.  If you have other questions about your diagnosis, plan, or surgery, call the office and ask for  your surgeon's nurse.  Pt Education - [facility] Breast Biopsy HCI: discussed with patient and provided information.

## 2018-03-03 ENCOUNTER — Other Ambulatory Visit: Payer: Self-pay | Admitting: Surgery

## 2018-03-03 DIAGNOSIS — D242 Benign neoplasm of left breast: Secondary | ICD-10-CM

## 2018-03-14 ENCOUNTER — Other Ambulatory Visit: Payer: Self-pay

## 2018-03-14 ENCOUNTER — Encounter (HOSPITAL_BASED_OUTPATIENT_CLINIC_OR_DEPARTMENT_OTHER): Payer: Self-pay | Admitting: *Deleted

## 2018-03-15 ENCOUNTER — Encounter (HOSPITAL_BASED_OUTPATIENT_CLINIC_OR_DEPARTMENT_OTHER)
Admission: RE | Admit: 2018-03-15 | Discharge: 2018-03-15 | Disposition: A | Discharge status: Home / Self Care | Payer: 59 | Source: Ambulatory Visit | Attending: Surgery | Admitting: Surgery

## 2018-03-15 DIAGNOSIS — Z01812 Encounter for preprocedural laboratory examination: Secondary | ICD-10-CM | POA: Insufficient documentation

## 2018-03-15 LAB — CBC WITH DIFFERENTIAL/PLATELET
Abs Immature Granulocytes: 0 10*3/uL (ref 0.00–0.07)
Basophils Absolute: 0 10*3/uL (ref 0.0–0.1)
Basophils Relative: 0 %
Eosinophils Absolute: 0.1 10*3/uL (ref 0.0–0.5)
Eosinophils Relative: 1 %
HCT: 39.2 % (ref 36.0–46.0)
Hemoglobin: 12.9 g/dL (ref 12.0–15.0)
IMMATURE GRANULOCYTES: 0 %
LYMPHS PCT: 47 %
Lymphs Abs: 2 10*3/uL (ref 0.7–4.0)
MCH: 28.7 pg (ref 26.0–34.0)
MCHC: 32.9 g/dL (ref 30.0–36.0)
MCV: 87.1 fL (ref 80.0–100.0)
Monocytes Absolute: 0.3 10*3/uL (ref 0.1–1.0)
Monocytes Relative: 7 %
NEUTROS ABS: 1.9 10*3/uL (ref 1.7–7.7)
Neutrophils Relative %: 45 %
Platelets: 237 10*3/uL (ref 150–400)
RBC: 4.5 MIL/uL (ref 3.87–5.11)
RDW: 13.2 % (ref 11.5–15.5)
WBC: 4.2 10*3/uL (ref 4.0–10.5)
nRBC: 0 % (ref 0.0–0.2)

## 2018-03-15 LAB — COMPREHENSIVE METABOLIC PANEL
ALT: 23 U/L (ref 0–44)
AST: 19 U/L (ref 15–41)
Albumin: 4 g/dL (ref 3.5–5.0)
Alkaline Phosphatase: 88 U/L (ref 38–126)
Anion gap: 12 (ref 5–15)
BUN: 11 mg/dL (ref 6–20)
CO2: 25 mmol/L (ref 22–32)
Calcium: 9.4 mg/dL (ref 8.9–10.3)
Chloride: 102 mmol/L (ref 98–111)
Creatinine, Ser: 0.76 mg/dL (ref 0.44–1.00)
GFR calc Af Amer: >60 mL/min (ref >60)
GFR calc non Af Amer: >60 mL/min (ref >60)
Glucose, Bld: 114 mg/dL — ABNORMAL HIGH (ref 70–99)
Potassium: 4 mmol/L (ref 3.5–5.1)
Sodium: 139 mmol/L (ref 135–145)
Total Bilirubin: 0.3 mg/dL (ref 0.3–1.2)
Total Protein: 7.5 g/dL (ref 6.5–8.1)

## 2018-03-15 NOTE — Progress Notes (Signed)
Ensure pre surgery drink given with instructions to complete by [place], pt verbalized understanding.  bp-162/101, pt states PCP has changed her blood pressure meds, Dr. Daiva Huge aware, will proceed with surgery as scheduled.

## 2018-03-17 ENCOUNTER — Ambulatory Visit
Admission: RE | Admit: 2018-03-17 | Discharge: 2018-03-17 | Disposition: A | Discharge status: Home / Self Care | Payer: 59 | Source: Ambulatory Visit | Attending: Surgery | Admitting: Surgery

## 2018-03-17 ENCOUNTER — Other Ambulatory Visit: Payer: Self-pay | Admitting: Surgery

## 2018-03-17 DIAGNOSIS — D242 Benign neoplasm of left breast: Secondary | ICD-10-CM

## 2018-03-21 ENCOUNTER — Encounter (HOSPITAL_BASED_OUTPATIENT_CLINIC_OR_DEPARTMENT_OTHER)
Admission: RE | Disposition: A | Discharge status: Home / Self Care | Payer: Self-pay | Source: Home / Self Care | Attending: Surgery

## 2018-03-21 ENCOUNTER — Ambulatory Visit (HOSPITAL_BASED_OUTPATIENT_CLINIC_OR_DEPARTMENT_OTHER): Payer: 59 | Admitting: Anesthesiology

## 2018-03-21 ENCOUNTER — Encounter (HOSPITAL_BASED_OUTPATIENT_CLINIC_OR_DEPARTMENT_OTHER): Payer: Self-pay | Admitting: *Deleted

## 2018-03-21 ENCOUNTER — Ambulatory Visit
Admission: RE | Admit: 2018-03-21 | Discharge: 2018-03-21 | Disposition: A | Discharge status: Home / Self Care | Payer: 59 | Source: Ambulatory Visit | Attending: Surgery | Admitting: Surgery

## 2018-03-21 ENCOUNTER — Ambulatory Visit (HOSPITAL_BASED_OUTPATIENT_CLINIC_OR_DEPARTMENT_OTHER)
Admission: RE | Admit: 2018-03-21 | Discharge: 2018-03-21 | Disposition: A | Discharge status: Home / Self Care | Payer: 59 | Attending: Surgery | Admitting: Surgery

## 2018-03-21 ENCOUNTER — Other Ambulatory Visit: Payer: Self-pay

## 2018-03-21 DIAGNOSIS — E7800 Pure hypercholesterolemia, unspecified: Secondary | ICD-10-CM | POA: Diagnosis not present

## 2018-03-21 DIAGNOSIS — I10 Essential (primary) hypertension: Secondary | ICD-10-CM | POA: Insufficient documentation

## 2018-03-21 DIAGNOSIS — D242 Benign neoplasm of left breast: Secondary | ICD-10-CM | POA: Insufficient documentation

## 2018-03-21 DIAGNOSIS — G35 Multiple sclerosis: Secondary | ICD-10-CM | POA: Diagnosis not present

## 2018-03-21 HISTORY — PX: BREAST LUMPECTOMY WITH RADIOACTIVE SEED LOCALIZATION: SHX6424

## 2018-03-21 SURGERY — BREAST LUMPECTOMY WITH RADIOACTIVE SEED LOCALIZATION
Anesthesia: General | Site: Breast | Laterality: Left

## 2018-03-21 MED ORDER — ACETAMINOPHEN 500 MG PO TABS
1000.0000 mg | ORAL_TABLET | ORAL | Status: AC
  Administered 2018-03-21: 1000 mg via ORAL

## 2018-03-21 MED ORDER — FENTANYL CITRATE (PF) 100 MCG/2ML IJ SOLN
INTRAMUSCULAR | Status: AC
  Filled 2018-03-21: qty 2

## 2018-03-21 MED ORDER — CEFAZOLIN SODIUM-DEXTROSE 2-4 GM/100ML-% IV SOLN
INTRAVENOUS | Status: AC
  Filled 2018-03-21: qty 100

## 2018-03-21 MED ORDER — LACTATED RINGERS IV SOLN
INTRAVENOUS | Status: DC
  Administered 2018-03-21: 10:00:00 via INTRAVENOUS

## 2018-03-21 MED ORDER — SCOPOLAMINE 1 MG/3DAYS TD PT72: 1.0000 | MEDICATED_PATCH | Freq: Once | TRANSDERMAL | Status: DC | PRN

## 2018-03-21 MED ORDER — ONDANSETRON HCL 4 MG/2ML IJ SOLN
INTRAMUSCULAR | Status: DC | PRN
  Administered 2018-03-21: 4 mg via INTRAVENOUS

## 2018-03-21 MED ORDER — CHLORHEXIDINE GLUCONATE CLOTH 2 % EX PADS: 6.0000 | MEDICATED_PAD | Freq: Once | CUTANEOUS | Status: DC

## 2018-03-21 MED ORDER — GABAPENTIN 300 MG PO CAPS
ORAL_CAPSULE | ORAL | Status: AC
  Filled 2018-03-21: qty 1

## 2018-03-21 MED ORDER — ACETAMINOPHEN 10 MG/ML IV SOLN: 1000.0000 mg | Freq: Once | INTRAVENOUS | Status: DC | PRN

## 2018-03-21 MED ORDER — MIDAZOLAM HCL 2 MG/2ML IJ SOLN
1.0000 mg | INTRAMUSCULAR | Status: DC | PRN
  Administered 2018-03-21: 2 mg via INTRAVENOUS

## 2018-03-21 MED ORDER — CEFAZOLIN SODIUM-DEXTROSE 2-4 GM/100ML-% IV SOLN
2.0000 g | INTRAVENOUS | Status: AC
  Administered 2018-03-21: 2 g via INTRAVENOUS

## 2018-03-21 MED ORDER — OXYCODONE HCL 5 MG PO TABS: 5.0000 mg | ORAL_TABLET | Freq: Once | ORAL | Status: DC | PRN

## 2018-03-21 MED ORDER — LIDOCAINE HCL (CARDIAC) PF 100 MG/5ML IV SOSY
PREFILLED_SYRINGE | INTRAVENOUS | Status: DC | PRN
  Administered 2018-03-21: 30 mg via INTRAVENOUS
  Administered 2018-03-21: 50 mg via INTRAVENOUS

## 2018-03-21 MED ORDER — DEXTROSE 5 % IV SOLN: 3.0000 g | INTRAVENOUS | Status: DC

## 2018-03-21 MED ORDER — CELECOXIB 200 MG PO CAPS
ORAL_CAPSULE | ORAL | Status: AC
  Filled 2018-03-21: qty 1

## 2018-03-21 MED ORDER — IBUPROFEN 800 MG PO TABS: 800.0000 mg | ORAL_TABLET | Freq: Three times a day (TID) | ORAL | 0 refills | Status: AC | PRN

## 2018-03-21 MED ORDER — LIDOCAINE 2% (20 MG/ML) 5 ML SYRINGE
INTRAMUSCULAR | Status: AC
  Filled 2018-03-21: qty 5

## 2018-03-21 MED ORDER — PROPOFOL 10 MG/ML IV BOLUS
INTRAVENOUS | Status: AC
  Filled 2018-03-21: qty 20

## 2018-03-21 MED ORDER — PHENYLEPHRINE HCL 10 MG/ML IJ SOLN
INTRAMUSCULAR | Status: DC | PRN
  Administered 2018-03-21 (×2): 80 ug via INTRAVENOUS

## 2018-03-21 MED ORDER — DEXAMETHASONE SODIUM PHOSPHATE 10 MG/ML IJ SOLN
INTRAMUSCULAR | Status: DC | PRN
  Administered 2018-03-21: 4 mg via INTRAVENOUS

## 2018-03-21 MED ORDER — FENTANYL CITRATE (PF) 100 MCG/2ML IJ SOLN
50.0000 ug | INTRAMUSCULAR | Status: DC | PRN
  Administered 2018-03-21: 100 ug via INTRAVENOUS

## 2018-03-21 MED ORDER — ONDANSETRON HCL 4 MG/2ML IJ SOLN
INTRAMUSCULAR | Status: AC
  Filled 2018-03-21: qty 2

## 2018-03-21 MED ORDER — MIDAZOLAM HCL 2 MG/2ML IJ SOLN
INTRAMUSCULAR | Status: AC
  Filled 2018-03-21: qty 2

## 2018-03-21 MED ORDER — ACETAMINOPHEN 500 MG PO TABS
ORAL_TABLET | ORAL | Status: AC
  Filled 2018-03-21: qty 2

## 2018-03-21 MED ORDER — ACETAMINOPHEN 500 MG PO TABS
ORAL_TABLET | ORAL | Status: AC
  Filled 2018-03-21: qty 1

## 2018-03-21 MED ORDER — OXYCODONE HCL 5 MG/5ML PO SOLN: 5.0000 mg | Freq: Once | ORAL | Status: DC | PRN

## 2018-03-21 MED ORDER — CELECOXIB 200 MG PO CAPS
200.0000 mg | ORAL_CAPSULE | ORAL | Status: AC
  Administered 2018-03-21: 200 mg via ORAL

## 2018-03-21 MED ORDER — PROMETHAZINE HCL 25 MG/ML IJ SOLN: 6.2500 mg | INTRAMUSCULAR | Status: DC | PRN

## 2018-03-21 MED ORDER — DEXAMETHASONE SODIUM PHOSPHATE 10 MG/ML IJ SOLN
INTRAMUSCULAR | Status: AC
  Filled 2018-03-21: qty 1

## 2018-03-21 MED ORDER — FENTANYL CITRATE (PF) 100 MCG/2ML IJ SOLN: 25.0000 ug | INTRAMUSCULAR | Status: DC | PRN

## 2018-03-21 MED ORDER — BUPIVACAINE-EPINEPHRINE (PF) 0.25% -1:200000 IJ SOLN
INTRAMUSCULAR | Status: DC | PRN
  Administered 2018-03-21: 15 mL

## 2018-03-21 MED ORDER — PHENYLEPHRINE 40 MCG/ML (10ML) SYRINGE FOR IV PUSH (FOR BLOOD PRESSURE SUPPORT)
PREFILLED_SYRINGE | INTRAVENOUS | Status: AC
  Filled 2018-03-21: qty 10

## 2018-03-21 MED ORDER — GABAPENTIN 300 MG PO CAPS
300.0000 mg | ORAL_CAPSULE | ORAL | Status: AC
  Administered 2018-03-21: 300 mg via ORAL

## 2018-03-21 MED ORDER — PROPOFOL 10 MG/ML IV BOLUS
INTRAVENOUS | Status: DC | PRN
  Administered 2018-03-21: 150 mg via INTRAVENOUS

## 2018-03-21 MED ORDER — CEFAZOLIN SODIUM 1 G IJ SOLR
INTRAMUSCULAR | Status: AC
  Filled 2018-03-21: qty 10

## 2018-03-21 SURGICAL SUPPLY — 58 items
ADH SKN CLS APL DERMABOND .7 (GAUZE/BANDAGES/DRESSINGS) ×1
APPLIER CLIP 9.375 MED OPEN (MISCELLANEOUS)
APR CLP MED 9.3 20 MLT OPN (MISCELLANEOUS)
BINDER BREAST LRG (GAUZE/BANDAGES/DRESSINGS) IMPLANT
BINDER BREAST MEDIUM (GAUZE/BANDAGES/DRESSINGS) IMPLANT
BINDER BREAST XLRG (GAUZE/BANDAGES/DRESSINGS) ×3 IMPLANT
BINDER BREAST XXLRG (GAUZE/BANDAGES/DRESSINGS) IMPLANT
BLADE SURG 15 STRL LF DISP TIS (BLADE) ×1 IMPLANT
BLADE SURG 15 STRL SS (BLADE) ×3
CANISTER SUC SOCK COL 7IN (MISCELLANEOUS) IMPLANT
CANISTER SUCT 1200ML W/VALVE (MISCELLANEOUS) IMPLANT
CHLORAPREP W/TINT 26ML (MISCELLANEOUS) ×3 IMPLANT
CLIP APPLIE 9.375 MED OPEN (MISCELLANEOUS) IMPLANT
COVER BACK TABLE 60X90IN (DRAPES) ×3 IMPLANT
COVER MAYO STAND STRL (DRAPES) ×3 IMPLANT
COVER PROBE W GEL 5X96 (DRAPES) ×3 IMPLANT
COVER WAND RF STERILE (DRAPES) IMPLANT
DECANTER SPIKE VIAL GLASS SM (MISCELLANEOUS) IMPLANT
DERMABOND ADVANCED (GAUZE/BANDAGES/DRESSINGS) ×2
DERMABOND ADVANCED .7 DNX12 (GAUZE/BANDAGES/DRESSINGS) ×1 IMPLANT
DRAPE LAPAROSCOPIC ABDOMINAL (DRAPES) IMPLANT
DRAPE LAPAROTOMY 100X72 PEDS (DRAPES) ×3 IMPLANT
DRAPE UTILITY XL STRL (DRAPES) ×3 IMPLANT
ELECT COATED BLADE 2.86 ST (ELECTRODE) ×3 IMPLANT
ELECT REM PT RETURN 9FT ADLT (ELECTROSURGICAL) ×3
ELECTRODE REM PT RTRN 9FT ADLT (ELECTROSURGICAL) ×1 IMPLANT
GLOVE BIO SURGEON STRL SZ 6.5 (GLOVE) ×2 IMPLANT
GLOVE BIO SURGEON STRL SZ7 (GLOVE) ×3 IMPLANT
GLOVE BIO SURGEONS STRL SZ 6.5 (GLOVE) ×1
GLOVE BIOGEL PI IND STRL 7.5 (GLOVE) ×1 IMPLANT
GLOVE BIOGEL PI IND STRL 8 (GLOVE) ×1 IMPLANT
GLOVE BIOGEL PI INDICATOR 7.5 (GLOVE) ×2
GLOVE BIOGEL PI INDICATOR 8 (GLOVE) ×2
GLOVE ECLIPSE 8.0 STRL XLNG CF (GLOVE) ×3 IMPLANT
GLOVE SS BIOGEL STRL SZ 7 (GLOVE) ×1 IMPLANT
GLOVE SUPERSENSE BIOGEL SZ 7 (GLOVE) ×2
GOWN STRL REUS W/ TWL LRG LVL3 (GOWN DISPOSABLE) ×2 IMPLANT
GOWN STRL REUS W/ TWL XL LVL3 (GOWN DISPOSABLE) ×1 IMPLANT
GOWN STRL REUS W/TWL LRG LVL3 (GOWN DISPOSABLE) ×4
GOWN STRL REUS W/TWL XL LVL3 (GOWN DISPOSABLE) ×2
HEMOSTAT ARISTA ABSORB 3G PWDR (HEMOSTASIS) IMPLANT
HEMOSTAT SNOW SURGICEL 2X4 (HEMOSTASIS) IMPLANT
KIT MARKER MARGIN INK (KITS) ×3 IMPLANT
NEEDLE HYPO 25X1 1.5 SAFETY (NEEDLE) ×3 IMPLANT
NS IRRIG 1000ML POUR BTL (IV SOLUTION) ×3 IMPLANT
PACK BASIN DAY SURGERY FS (CUSTOM PROCEDURE TRAY) ×3 IMPLANT
PENCIL BUTTON HOLSTER BLD 10FT (ELECTRODE) ×3 IMPLANT
SLEEVE SCD COMPRESS KNEE MED (MISCELLANEOUS) ×3 IMPLANT
SPONGE LAP 4X18 RFD (DISPOSABLE) ×3 IMPLANT
SUT MNCRL AB 4-0 PS2 18 (SUTURE) ×3 IMPLANT
SUT SILK 2 0 SH (SUTURE) IMPLANT
SUT VICRYL 3-0 CR8 SH (SUTURE) ×3 IMPLANT
SYR CONTROL 10ML LL (SYRINGE) ×3 IMPLANT
TOWEL GREEN STERILE FF (TOWEL DISPOSABLE) ×3 IMPLANT
TRAY FAXITRON CT DISP (TRAY / TRAY PROCEDURE) ×3 IMPLANT
TUBE CONNECTING 20'X1/4 (TUBING)
TUBE CONNECTING 20X1/4 (TUBING) IMPLANT
YANKAUER SUCT BULB TIP NO VENT (SUCTIONS) IMPLANT

## 2018-03-21 NOTE — Op Note (Signed)
Preoperative diagnosis: Left breast papilloma  Postoperative diagnosis: Same  Procedure: Left breast seed localized lumpectomy  Surgeon: Erroll Luna, MD  Anesthesia: General with local  EBL: 10 cc  IV fluids: Per anesthesia record    EBL: 10 cc  Indications for procedure: The patient presents for excision of left breast papilloma diagnosed by mammography and subsequent core biopsy.  Risk, benefits and alternatives to surgery were discussed with the patient.The procedure has been discussed with the patient. Alternatives to surgery have been discussed with the patient.  Risks of surgery include bleeding,  Infection,  Seroma formation, death,  and the need for further surgery.   The patient understands and wishes to proceed.   Description of procedure: The patient was met in the holding area and questions were answered.  Neoprobe was used to identify the seed in the left breast.  She was taken back the operative room and placed supine upon the operating room table.  After induction of LMA anesthesia, the left breast was prepped and draped in sterile fashion timeout was done.  Proper patient and procedure were verified.  Neoprobe was used and the seed was identified below the nipple areolar complex.  A medial incision was made along the medial border of the nipple areolar complex.  Dissection was carried down all tissue around the seed and clip were excised with a grossly negative margin.  Hemostasis achieved.  Faxitron image revealed both seed and clip to be in the specimen and this was sent to pathology.  Wound was irrigated and made hemostatic.  It was then closed with 3-0 Vicryl and 4-0 Monocryl.  Dermabond applied.  All final counts found to be correct of sponge, needle and instruments.  Patient was awoke extubated taken recovery in satisfactory condition.

## 2018-03-21 NOTE — Transfer of Care (Signed)
Immediate Anesthesia Transfer of Care Note  Patient: Vickie Morrow  Procedure(s) Performed: LEFT BREAST LUMPECTOMY WITH RADIOACTIVE SEED LOCALIZATION (Left Breast)  Patient Location: PACU  Anesthesia Type:General  Level of Consciousness: awake, alert  and oriented  Airway & Oxygen Therapy: Patient Spontanous Breathing and Patient connected to face mask oxygen  Post-op Assessment: Report given to RN and Post -op Vital signs reviewed and stable  Post vital signs: Reviewed and stable  Last Vitals:  Vitals Value Taken Time  BP    Temp    Pulse 77 03/21/2018  1:12 PM  Resp    SpO2 100 % 03/21/2018  1:12 PM  Vitals shown include unvalidated device data.  Last Pain:  Vitals:   03/21/18 1015  TempSrc: Oral  PainSc: 0-No pain         Complications: No apparent anesthesia complications

## 2018-03-21 NOTE — Anesthesia Procedure Notes (Signed)
Procedure Name: LMA Insertion Date/Time: 03/21/2018 12:26 PM Performed by: Jonna Munro, CRNA Pre-anesthesia Checklist: Patient identified, Emergency Drugs available, Suction available, Patient being monitored and Timeout performed Patient Re-evaluated:Patient Re-evaluated prior to induction Oxygen Delivery Method: Circle system utilized Preoxygenation: Pre-oxygenation with 100% oxygen Induction Type: IV induction LMA: LMA inserted LMA Size: 4.0 Number of attempts: 1 Placement Confirmation: positive ETCO2 and breath sounds checked- equal and bilateral Dental Injury: Teeth and Oropharynx as per pre-operative assessment

## 2018-03-21 NOTE — Interval H&P Note (Signed)
History and Physical Interval Note:  03/21/2018 10:12 AM  Vickie Morrow  has presented today for surgery, with the diagnosis of LEFT BREAST PAPILLOMA  The various methods of treatment have been discussed with the patient and family. After consideration of risks, benefits and other options for treatment, the patient has consented to  Procedure(s): LEFT BREAST LUMPECTOMY WITH RADIOACTIVE SEED LOCALIZATION (Left) as a surgical intervention .  The patient's history has been reviewed, patient examined, no change in status, stable for surgery.  I have reviewed the patient's chart and labs.  Questions were answered to the patient's satisfaction.     [place]

## 2018-03-21 NOTE — Anesthesia Preprocedure Evaluation (Signed)
Anesthesia Evaluation  Patient identified by MRN, date of birth, ID band Patient awake    Reviewed: Allergy & Precautions, NPO status , Patient's Chart, lab work & pertinent test results  History of Anesthesia Complications Negative for: history of anesthetic complications  Airway Mallampati: II  TM Distance: >3 FB Neck ROM: Full    Dental  (+) Teeth Intact, Dental Advisory Given   Pulmonary neg pulmonary ROS,    Pulmonary exam normal breath sounds clear to auscultation       Cardiovascular hypertension, Pt. on medications Normal cardiovascular exam Rhythm:Regular Rate:Normal     Neuro/Psych Multiple sclerosis    GI/Hepatic negative GI ROS, Neg liver ROS,   Endo/Other  negative endocrine ROS  Renal/GU negative Renal ROS     Musculoskeletal negative musculoskeletal ROS (+)   Abdominal   Peds  Hematology negative hematology ROS (+)   Anesthesia Other Findings Day of surgery medications reviewed with the patient.  Reproductive/Obstetrics                             Anesthesia Physical Anesthesia Plan  ASA: II  Anesthesia Plan: General   Post-op Pain Management:    Induction: Intravenous  PONV Risk Score and Plan: 3 and Treatment may vary due to age or medical condition, Ondansetron, Dexamethasone and Midazolam  Airway Management Planned: LMA  Additional Equipment:   Intra-op Plan:   Post-operative Plan: Extubation in OR  Informed Consent: I have reviewed the patients History and Physical, chart, labs and discussed the procedure including the risks, benefits and alternatives for the proposed anesthesia with the patient or authorized representative who has indicated his/her understanding and acceptance.     Dental advisory given  Plan Discussed with: CRNA  Anesthesia Plan Comments:         Anesthesia Quick Evaluation

## 2018-03-21 NOTE — Anesthesia Postprocedure Evaluation (Signed)
Anesthesia Post Note  Patient: Dayshia Ballinas  Procedure(s) Performed: LEFT BREAST LUMPECTOMY WITH RADIOACTIVE SEED LOCALIZATION (Left Breast)     Patient location during evaluation: PACU Anesthesia Type: General Level of consciousness: awake and alert Pain management: pain level controlled Vital Signs Assessment: post-procedure vital signs reviewed and stable Respiratory status: spontaneous breathing, nonlabored ventilation and respiratory function stable Cardiovascular status: blood pressure returned to baseline and stable Postop Assessment: no apparent nausea or vomiting Anesthetic complications: no    Last Vitals:  Vitals:   03/21/18 1400 03/21/18 1409  BP: 112/87   Pulse: 69 71  Resp: 13 16  Temp:    SpO2: 99% 99%    Last Pain:  Vitals:   03/21/18 1409  TempSrc:   PainSc: 0-No pain                 Brennan Bailey

## 2018-03-21 NOTE — Discharge Instructions (Signed)
Next dose of Tylenol or Ibuprofen at 4:20 PM   [facility] Office Phone Number [phone]  BREAST BIOPSY POST OP INSTRUCTIONS  Always review your discharge instruction sheet given to you by the facility where your surgery was performed.  IF YOU HAVE DISABILITY OR FAMILY LEAVE FORMS, YOU MUST BRING THEM TO THE OFFICE FOR PROCESSING.  DO NOT GIVE THEM TO YOUR DOCTOR.  1. A prescription for pain medication may be given to you upon discharge.  Take your pain medication as prescribed, if needed.  If narcotic pain medicine is not needed, then you may take acetaminophen (Tylenol) or ibuprofen (Advil) as needed. 2. Take your usually prescribed medications unless otherwise directed 3. If you need a refill on your pain medication, please contact your pharmacy.  They will contact our office to request authorization.  Prescriptions will not be filled after 5pm or on week-ends. 4. You should eat very light the first 24 hours after surgery, such as soup, crackers, pudding, etc.  Resume your normal diet the day after surgery. 5. Most patients will experience some swelling and bruising in the breast.  Ice packs and a good support bra will help.  Swelling and bruising can take several days to resolve.  6. It is common to experience some constipation if taking pain medication after surgery.  Increasing fluid intake and taking a stool softener will usually help or prevent this problem from occurring.  A mild laxative (Milk of Magnesia or Miralax) should be taken according to package directions if there are no bowel movements after 48 hours. 7. Unless discharge instructions indicate otherwise, you may remove your bandages 24-48 hours after surgery, and you may shower at that time.  You may have steri-strips (small skin tapes) in place directly over the incision.  These strips should be left on the skin for 7-10 days.  If your surgeon used skin glue on the incision, you may shower in 24 hours.  The  glue will flake off over the next 2-3 weeks.  Any sutures or staples will be removed at the office during your follow-up visit. 8. ACTIVITIES:  You may resume regular daily activities (gradually increasing) beginning the next day.  Wearing a good support bra or sports bra minimizes pain and swelling.  You may have sexual intercourse when it is comfortable. a. You may drive when you no longer are taking prescription pain medication, you can comfortably wear a seatbelt, and you can safely maneuver your car and apply brakes. b. RETURN TO WORK:  ______________________________________________________________________________________ 9. You should see your doctor in the office for a follow-up appointment approximately two weeks after your surgery.  Your doctors nurse will typically make your follow-up appointment when she calls you with your pathology report.  Expect your pathology report 2-3 business days after your surgery.  You may call to check if you do not hear from [place] after three days. 10. OTHER INSTRUCTIONS: _______________________________________________________________________________________________ _____________________________________________________________________________________________________________________________________ _____________________________________________________________________________________________________________________________________ _____________________________________________________________________________________________________________________________________  WHEN TO CALL YOUR DOCTOR: 1. Fever over 101.0 2. Nausea and/or vomiting. 3. Extreme swelling or bruising. 4. Continued bleeding from incision. 5. Increased pain, redness, or drainage from the incision.  The clinic staff is available to answer your questions during regular business hours.  Please dont hesitate to call and ask to speak to one of the nurses for clinical concerns.  If you have a medical  emergency, go to the nearest emergency room or call 911.  A surgeon from [facility] is always on call at the  hospital.  For further questions, please visit [email or web address]      Post Anesthesia Home Care Instructions  Activity: Get plenty of rest for the remainder of the day. A responsible individual must stay with you for 24 hours following the procedure.  For the next 24 hours, DO NOT: -Drive a car -Paediatric nurse -Drink alcoholic beverages -Take any medication unless instructed by your physician -Make any legal decisions or sign important papers.  Meals: Start with liquid foods such as gelatin or soup. Progress to regular foods as tolerated. Avoid greasy, spicy, heavy foods. If nausea and/or vomiting occur, drink only clear liquids until the nausea and/or vomiting subsides. Call your physician if vomiting continues.  Special Instructions/Symptoms: Your throat may feel dry or sore from the anesthesia or the breathing tube placed in your throat during surgery. If this causes discomfort, gargle with warm salt water. The discomfort should disappear within 24 hours.  If you had a scopolamine patch placed behind your ear for the management of post- operative nausea and/or vomiting:  1. The medication in the patch is effective for 72 hours, after which it should be removed.  Wrap patch in a tissue and discard in the trash. Wash hands thoroughly with soap and water. 2. You may remove the patch earlier than 72 hours if you experience unpleasant side effects which may include dry mouth, dizziness or visual disturbances. 3. Avoid touching the patch. Wash your hands with soap and water after contact with the patch.

## 2018-03-22 ENCOUNTER — Encounter (HOSPITAL_BASED_OUTPATIENT_CLINIC_OR_DEPARTMENT_OTHER): Payer: Self-pay | Admitting: Surgery

## 2018-03-24 ENCOUNTER — Other Ambulatory Visit: Payer: Self-pay | Admitting: Neurology

## 2019-03-29 ENCOUNTER — Other Ambulatory Visit: Payer: Self-pay | Admitting: Internal Medicine

## 2019-03-29 DIAGNOSIS — Z1231 Encounter for screening mammogram for malignant neoplasm of breast: Secondary | ICD-10-CM

## 2019-05-04 ENCOUNTER — Ambulatory Visit
Admission: RE | Admit: 2019-05-04 | Discharge: 2019-05-04 | Disposition: A | Discharge status: Home / Self Care | Payer: 59 | Source: Ambulatory Visit | Attending: Internal Medicine | Admitting: Internal Medicine

## 2019-05-04 ENCOUNTER — Other Ambulatory Visit: Payer: Self-pay

## 2019-05-04 DIAGNOSIS — Z1231 Encounter for screening mammogram for malignant neoplasm of breast: Secondary | ICD-10-CM

## 2020-06-06 ENCOUNTER — Other Ambulatory Visit: Payer: Self-pay | Admitting: Internal Medicine

## 2020-06-06 DIAGNOSIS — Z1231 Encounter for screening mammogram for malignant neoplasm of breast: Secondary | ICD-10-CM

## 2020-08-05 ENCOUNTER — Other Ambulatory Visit: Payer: Self-pay

## 2020-08-05 ENCOUNTER — Ambulatory Visit
Admission: RE | Admit: 2020-08-05 | Discharge: 2020-08-05 | Disposition: A | Discharge status: Home / Self Care | Payer: BC Managed Care – PPO | Source: Ambulatory Visit | Attending: Internal Medicine | Admitting: Internal Medicine

## 2020-08-05 DIAGNOSIS — Z1231 Encounter for screening mammogram for malignant neoplasm of breast: Secondary | ICD-10-CM

## 2021-07-16 ENCOUNTER — Other Ambulatory Visit: Payer: Self-pay | Admitting: Internal Medicine

## 2021-07-16 DIAGNOSIS — Z1231 Encounter for screening mammogram for malignant neoplasm of breast: Secondary | ICD-10-CM

## 2021-08-13 ENCOUNTER — Emergency Department (HOSPITAL_COMMUNITY): Payer: BC Managed Care – PPO

## 2021-08-13 ENCOUNTER — Emergency Department (HOSPITAL_COMMUNITY)
Admission: EM | Admit: 2021-08-13 | Discharge: 2021-08-14 | Disposition: A | Discharge status: Home / Self Care | Payer: BC Managed Care – PPO | Attending: Emergency Medicine | Admitting: Emergency Medicine

## 2021-08-13 ENCOUNTER — Encounter (HOSPITAL_COMMUNITY): Payer: Self-pay | Admitting: Emergency Medicine

## 2021-08-13 DIAGNOSIS — I10 Essential (primary) hypertension: Secondary | ICD-10-CM | POA: Diagnosis not present

## 2021-08-13 DIAGNOSIS — Z79899 Other long term (current) drug therapy: Secondary | ICD-10-CM | POA: Insufficient documentation

## 2021-08-13 DIAGNOSIS — N3090 Cystitis, unspecified without hematuria: Secondary | ICD-10-CM | POA: Insufficient documentation

## 2021-08-13 DIAGNOSIS — R194 Change in bowel habit: Secondary | ICD-10-CM | POA: Diagnosis present

## 2021-08-13 DIAGNOSIS — K5900 Constipation, unspecified: Secondary | ICD-10-CM | POA: Diagnosis not present

## 2021-08-13 LAB — COMPREHENSIVE METABOLIC PANEL
ALT: 20 U/L (ref 0–44)
AST: 18 U/L (ref 15–41)
Albumin: 4.4 g/dL (ref 3.5–5.0)
Alkaline Phosphatase: 102 U/L (ref 38–126)
Anion gap: 12 (ref 5–15)
BUN: 8 mg/dL (ref 6–20)
CO2: 23 mmol/L (ref 22–32)
Calcium: 9.7 mg/dL (ref 8.9–10.3)
Chloride: 106 mmol/L (ref 98–111)
Creatinine, Ser: 1.33 mg/dL — ABNORMAL HIGH (ref 0.44–1.00)
GFR, Estimated: 48 mL/min — ABNORMAL LOW (ref >60)
Glucose, Bld: 106 mg/dL — ABNORMAL HIGH (ref 70–99)
Potassium: 3.7 mmol/L (ref 3.5–5.1)
Sodium: 141 mmol/L (ref 135–145)
Total Bilirubin: 0.7 mg/dL (ref 0.3–1.2)
Total Protein: 8.2 g/dL — ABNORMAL HIGH (ref 6.5–8.1)

## 2021-08-13 LAB — CBC
HCT: 39.8 % (ref 36.0–46.0)
Hemoglobin: 13.6 g/dL (ref 12.0–15.0)
MCH: 29.2 pg (ref 26.0–34.0)
MCHC: 34.2 g/dL (ref 30.0–36.0)
MCV: 85.6 fL (ref 80.0–100.0)
Platelets: 253 10*3/uL (ref 150–400)
RBC: 4.65 MIL/uL (ref 3.87–5.11)
RDW: 13 % (ref 11.5–15.5)
WBC: 11.3 10*3/uL — ABNORMAL HIGH (ref 4.0–10.5)
nRBC: 0 % (ref 0.0–0.2)

## 2021-08-13 LAB — LIPASE, BLOOD: Lipase: 27 U/L (ref 11–51)

## 2021-08-13 NOTE — ED Triage Notes (Signed)
PAtient here with complaint of feeling like she needs to have a bowel movement since yesterday but has been unable. Last bowel movement was a few days ago. Patient is alert, oriented, ambulatory and in no apparent distress at this time. Patient has tried magnesium citrate, suppositories, and 2x enemas.

## 2021-08-13 NOTE — ED Provider Triage Note (Signed)
Emergency Medicine Provider Triage Evaluation Note  Vickie Morrow , a 51 y.o. female  was evaluated in triage.  Pt complains of constipation onset 2-3 days. Pt notes that her last bowel movement was several days ago. Denies abdominal pain, nausea, vomiting, diarrhea. Has tried suppositories and enema.  LMP ~12 years ago.  Review of Systems  Positive: As per HPI Negative:   Physical Exam  BP (!) 169/106 (BP Location: Right Arm)   Pulse 95   Temp 99.6 F (37.6 C) (Oral)   Resp 16   LMP 09/30/2011   SpO2 99%  Gen:   Awake, no distress   Resp:  Normal effort  MSK:   Moves extremities without difficulty  Other:  No abdominal TTP.   Medical Decision Making  Medically screening exam initiated at 2:59 PM.  Appropriate orders placed.  Laquisha Northcraft was informed that the remainder of the evaluation will be completed by another provider, this initial triage assessment does not replace that evaluation, and the importance of remaining in the ED until their evaluation is complete.  Work-up initiated.    Marissa Lowrey A, PA-C 08/13/21 1511

## 2021-08-14 ENCOUNTER — Encounter (HOSPITAL_COMMUNITY): Payer: Self-pay | Admitting: Emergency Medicine

## 2021-08-14 ENCOUNTER — Other Ambulatory Visit: Payer: Self-pay

## 2021-08-14 ENCOUNTER — Ambulatory Visit
Admission: RE | Admit: 2021-08-14 | Discharge: 2021-08-14 | Disposition: A | Discharge status: Home / Self Care | Payer: BC Managed Care – PPO | Source: Ambulatory Visit | Attending: Internal Medicine | Admitting: Internal Medicine

## 2021-08-14 ENCOUNTER — Emergency Department (HOSPITAL_COMMUNITY): Payer: BC Managed Care – PPO

## 2021-08-14 DIAGNOSIS — Z1231 Encounter for screening mammogram for malignant neoplasm of breast: Secondary | ICD-10-CM

## 2021-08-14 LAB — URINALYSIS, ROUTINE W REFLEX MICROSCOPIC
Bilirubin Urine: NEGATIVE
Glucose, UA: NEGATIVE mg/dL
Ketones, ur: 5 mg/dL — AB
Nitrite: NEGATIVE
Protein, ur: NEGATIVE mg/dL
Specific Gravity, Urine: 1.01 (ref 1.005–1.030)
pH: 5 (ref 5.0–8.0)

## 2021-08-14 MED ORDER — ALPRAZOLAM 0.25 MG PO TABS
0.2500 mg | ORAL_TABLET | Freq: Once | ORAL | Status: AC
  Administered 2021-08-14: 0.25 mg via ORAL
  Filled 2021-08-14: qty 1

## 2021-08-14 MED ORDER — LIDOCAINE HCL URETHRAL/MUCOSAL 2 % EX GEL
1.0000 | Freq: Once | CUTANEOUS | Status: AC
  Administered 2021-08-14: 1 via TOPICAL
  Filled 2021-08-14: qty 11

## 2021-08-14 MED ORDER — IOHEXOL 300 MG/ML  SOLN
80.0000 mL | Freq: Once | INTRAMUSCULAR | Status: AC | PRN
  Administered 2021-08-14: 80 mL via INTRAVENOUS

## 2021-08-14 MED ORDER — MORPHINE SULFATE (PF) 4 MG/ML IV SOLN
4.0000 mg | Freq: Once | INTRAVENOUS | Status: AC
  Administered 2021-08-14: 4 mg via INTRAVENOUS
  Filled 2021-08-14: qty 1

## 2021-08-14 MED ORDER — NITROFURANTOIN MONOHYD MACRO 100 MG PO CAPS: 100.0000 mg | ORAL_CAPSULE | Freq: Two times a day (BID) | ORAL | 0 refills | Status: AC

## 2021-08-14 NOTE — Discharge Instructions (Addendum)
Drink plenty of fluids.  You may take one more dose of magnesium citrate when you get home.    You can take miralax, available over the counter.   Continue glycerin suppository at home.    Get rechecked if you have worsening pain, fever, vomiting or new concerning symptoms.

## 2021-08-14 NOTE — ED Provider Notes (Signed)
[facility] EMERGENCY DEPARTMENT Provider Note   CSN: [id number] Arrival date & time: 08/13/21  1241     History  Chief Complaint  Patient presents with   Constipation    Vickie Morrow is a 51 y.o. female.  The history is provided by the patient and medical records.  Constipation Vickie Morrow is a 51 y.o. female who presents to the Emergency Department complaining of constipation.  She states this started yesterday when she attempted to have a bowel movement and it was very dry and would not come out.  She tried using a suppository and enema as well as magnesium citrate.  She has not had successful bowel movement since then but is now having leakage of liquid around which she feels like to be a large stool ball.  She has rectal discomfort but no abdominal pain.  She feels like she needs to push but nothing will come out. No prior similar symptoms.  Has hx/o HTN.  MS - not currently on meds.       Home Medications Prior to Admission medications   Medication Sig Start Date End Date Taking? Authorizing Provider  nitrofurantoin, macrocrystal-monohydrate, (MACROBID) 100 MG capsule Take 1 capsule (100 mg total) by mouth 2 (two) times daily. 08/14/21  Yes Quintella Reichert, MD  Biotin 5 MG TABS Take 5 mg by mouth daily.    [provider]  cyclobenzaprine (FLEXERIL) 10 MG tablet Take 1 tablet (10 mg total) by mouth at bedtime. 06/08/16   Dohmeier, Asencion Partridge, MD  ibuprofen (ADVIL,MOTRIN) 800 MG tablet Take 1 tablet (800 mg total) by mouth every 8 (eight) hours as needed. 03/21/18   Cornett, Marcello Moores, MD  losartan (COZAAR) 50 MG tablet Take 50 mg by mouth daily.    [provider]  Multiple Vitamins-Minerals (HAIR VITAMINS PO) Take 2 capsules by mouth daily.    [provider]  TECFIDERA 240 MG CPDR TAKE ONE CAPSULE (240 MG) BY MOUTH TWICE DAILY. STORE IN ORIGINAL CONTAINER AT ROOM TEMPERATURE. 09/24/16   Dohmeier, Asencion Partridge, MD      Allergies    Patient  has no known allergies.    Review of Systems   Review of Systems  Gastrointestinal:  Positive for constipation.  All other systems reviewed and are negative.   Physical Exam Updated Vital Signs BP 138/80   Pulse 88   Temp 98.1 F (36.7 C) (Oral)   Resp 18   LMP 09/30/2011   SpO2 98%  Physical Exam Vitals and nursing note reviewed.  Constitutional:      Appearance: She is well-developed.  HENT:     Head: Normocephalic and atraumatic.  Cardiovascular:     Rate and Rhythm: Normal rate and regular rhythm.  Pulmonary:     Effort: Pulmonary effort is normal. No respiratory distress.  Abdominal:     Palpations: Abdomen is soft.     Tenderness: There is no abdominal tenderness. There is no guarding or rebound.  Genitourinary:    Comments: No external hemorrhoids.  No rectal mass.  No palpable stool.  Normal rectal tone.  Musculoskeletal:        General: No tenderness.  Skin:    General: Skin is warm and dry.  Neurological:     Mental Status: She is alert and oriented to person, place, and time.  Psychiatric:        Behavior: Behavior normal.     ED Results / Procedures / Treatments   Labs (all labs ordered are listed, but  only abnormal results are displayed) Labs Reviewed  COMPREHENSIVE METABOLIC PANEL - Abnormal; Notable for the following components:      Result Value   Glucose, Bld 106 (*)    Creatinine, Ser 1.33 (*)    Total Protein 8.2 (*)    GFR, Estimated 48 (*)    All other components within normal limits  CBC - Abnormal; Notable for the following components:   WBC 11.3 (*)    All other components within normal limits  URINALYSIS, ROUTINE W REFLEX MICROSCOPIC - Abnormal; Notable for the following components:   APPearance HAZY (*)    Hgb urine dipstick LARGE (*)    Ketones, ur 5 (*)    Leukocytes,Ua MODERATE (*)    Bacteria, UA MANY (*)    All other components within normal limits  URINE CULTURE  LIPASE, BLOOD    EKG None  Radiology CT Abdomen  Pelvis W Contrast  Result Date: 08/14/2021 CLINICAL DATA:  Abdominal pain. EXAM: CT ABDOMEN AND PELVIS WITH CONTRAST TECHNIQUE: Multidetector CT imaging of the abdomen and pelvis was performed using the standard protocol following bolus administration of intravenous contrast. RADIATION DOSE REDUCTION: This exam was performed according to the departmental dose-optimization program which includes automated exposure control, adjustment of the mA and/or kV according to patient size and/or use of iterative reconstruction technique. CONTRAST:  63m OMNIPAQUE IOHEXOL 300 MG/ML  SOLN COMPARISON:  None Available. FINDINGS: Lower chest: The visualized lung bases are clear. No intra-abdominal free air or free fluid. Hepatobiliary: Probable mild fatty liver. No intrahepatic biliary dilatation. The gallbladder is unremarkable Pancreas: Unremarkable. No pancreatic ductal dilatation or surrounding inflammatory changes. Spleen: Normal in size without focal abnormality. Adrenals/Urinary Tract: The adrenal glands are unremarkable. Subcentimeter left renal upper pole hypodense lesion is too small to characterize. There is no hydronephrosis on either side. There is symmetric enhancement and excretion of contrast by both kidneys. The visualized ureters and urinary bladder appear unremarkable. Stomach/Bowel: Several small scattered colonic diverticula without active inflammatory changes. There is no bowel obstruction or active inflammation. The appendix is normal. Vascular/Lymphatic: Abdominal aorta and IVC unremarkable. No portal venous gas. There is no adenopathy. Reproductive: The uterus is anteverted and grossly unremarkable. No adnexal masses. Other: There is diastasis of anterior abdominal wall musculature with a small fat containing umbilical hernia. Musculoskeletal: No acute or significant osseous findings. IMPRESSION: 1. No acute intra-abdominal or pelvic pathology. 2. Colonic diverticulosis. No bowel obstruction. Normal  appendix. Electronically Signed   By: AAnner CreteM.D.   On: 08/14/2021 03:31   DG Abdomen 1 View  Result Date: 08/13/2021 CLINICAL DATA:  Pt states she has felt she needed to have a bowel movement since yesterday morning. Pt states she has pain in her rectum. EXAM: ABDOMEN - 1 VIEW COMPARISON:  None Available. FINDINGS: Bowel gas pattern is nonobstructive. Large amount of stool is identified within the rectum and LOWER sigmoid. No free intraperitoneal air. No evidence for organomegaly. No abnormal calcifications. Visualized osseous structures have a normal appearance. IMPRESSION: Nonobstructive bowel gas pattern. Significant stool within the rectum and LOWER sigmoid. Electronically Signed   By: ENolon NationsM.D.   On: 08/13/2021 15:40    Procedures Procedures    Medications Ordered in ED Medications  ALPRAZolam (XANAX) tablet 0.25 mg (0.25 mg Oral Given 08/14/21 0134)  lidocaine (XYLOCAINE) 2 % jelly 1 Application (1 Application Topical Given 08/14/21 0135)  morphine (PF) 4 MG/ML injection 4 mg (4 mg Intravenous Given 08/14/21 0230)  iohexol (OMNIPAQUE) 300 MG/ML  solution 80 mL (80 mLs Intravenous Contrast Given 08/14/21 0253)  lidocaine (XYLOCAINE) 2 % jelly 1 Application (1 Application Topical Given 08/14/21 0556)    ED Course/ Medical Decision Making/ A&P                           Medical Decision Making Amount and/or Complexity of Data Reviewed Labs: ordered. Radiology: ordered.  Risk Prescription drug management.   Pt with hx/o MS here with constipation, rectal pain.  CBC with mild leukocytosis.  Unable to palpate stool ball on DRE.  Enema attempted without significant return of stool.  CT abd pelvis obtained - no acute abnormality.  There is a visualized stool ball on CT. offered to attempt second disimpaction and patient declined due to discomfort.  Discussed alternative methods for managing her symptoms with additional over-the-counter treatments.  Will provide Xylocaine  for home use.  UA is concerning for UTI-we will start on antibiotics for cystitis that she does not have current systemic symptoms.  Discussed home care for constipation and fecal impaction with outpatient follow-up and return precautions.       Final Clinical Impression(s) / ED Diagnoses Final diagnoses:  Constipation, unspecified constipation type  Cystitis    Rx / DC Orders ED Discharge Orders          Ordered    nitrofurantoin, macrocrystal-monohydrate, (MACROBID) 100 MG capsule  2 times daily        08/14/21 0535    Ambulatory referral to Gastroenterology        08/14/21 9147              Quintella Reichert, MD 08/14/21 [phone]

## 2021-08-16 LAB — URINE CULTURE: Culture: 40000 — AB

## 2021-08-17 ENCOUNTER — Telehealth: Payer: Self-pay

## 2021-08-17 NOTE — Telephone Encounter (Signed)
Post ED Visit - Positive Culture Follow-up  Culture report reviewed by antimicrobial stewardship pharmacist: [place] Team '[x]'$  Luisa Hart, [place].D. '[]'$  Heide Guile, Pharm.D., BCPS AQ-ID '[]'$  Parks Neptune, Pharm.D., BCPS '[]'$  Alycia Rossetti, Pharm.D., BCPS '[]'$  [place], Pharm.D., BCPS, AAHIVP '[]'$  Legrand Como, Pharm.D., BCPS, AAHIVP '[]'$  Salome Arnt, PharmD, BCPS '[]'$  Johnnette Gourd, PharmD, BCPS '[]'$  Hughes Better, PharmD, BCPS '[]'$  Leeroy Cha, PharmD '[]'$  Laqueta Linden, PharmD, BCPS '[]'$  Albertina Parr, PharmD  [place] Team '[]'$  Leodis Sias, PharmD '[]'$  Lindell Spar, PharmD '[]'$  Royetta Asal, PharmD '[]'$  Graylin Shiver, Rph '[]'$  Rema Fendt) Glennon Mac, PharmD '[]'$  Arlyn Dunning, PharmD '[]'$  Netta Cedars, PharmD '[]'$  Dia Sitter, PharmD '[]'$  Leone Haven, PharmD '[]'$  Gretta Arab, PharmD '[]'$  Theodis Shove, PharmD '[]'$  Peggyann Juba, PharmD '[]'$  Reuel Boom, PharmD   Positive urine culture Treated with Nitrofurantoin Monohyd Macro, organism sensitive to the same and no further patient follow-up is required at this time.  Glennon Hamilton 08/17/2021, 10:09 AM

## 2022-02-04 IMAGING — MG MM DIGITAL SCREENING BILAT W/ TOMO AND CAD
8 series · 8 of 24 positions shown · non-contrast
Comparison: Previous exam(s).

CLINICAL DATA: Screening.

EXAM:
DIGITAL SCREENING BILATERAL MAMMOGRAM WITH TOMOSYNTHESIS AND CAD
TECHNIQUE: Bilateral screening digital craniocaudal and mediolateral oblique
mammograms were obtained. Bilateral screening digital breast
tomosynthesis was performed. The images were evaluated with
computer-aided detection.

[R MLO synth-2D]
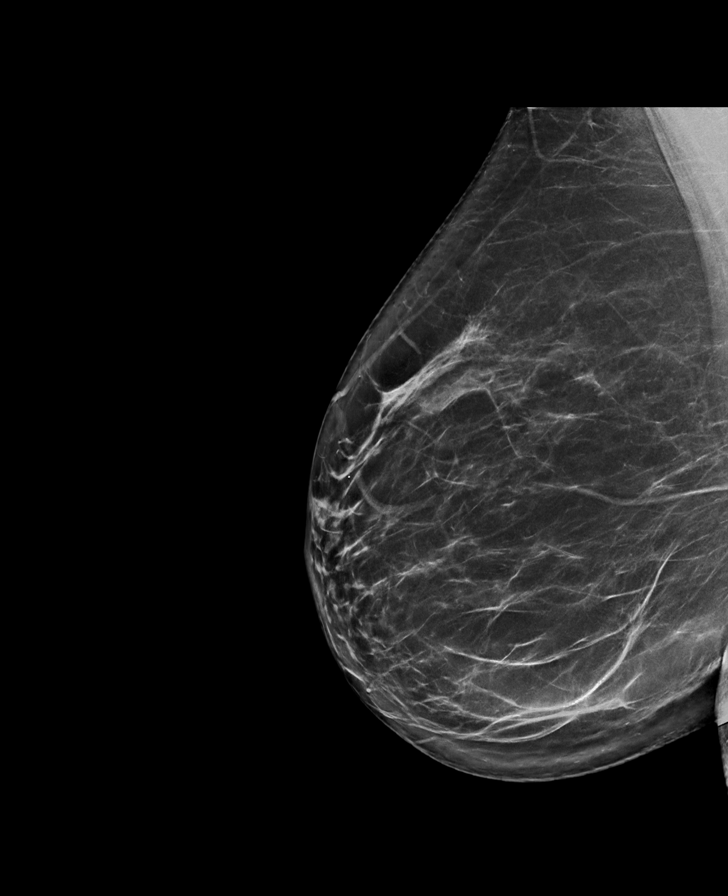

[L MLO synth-2D]
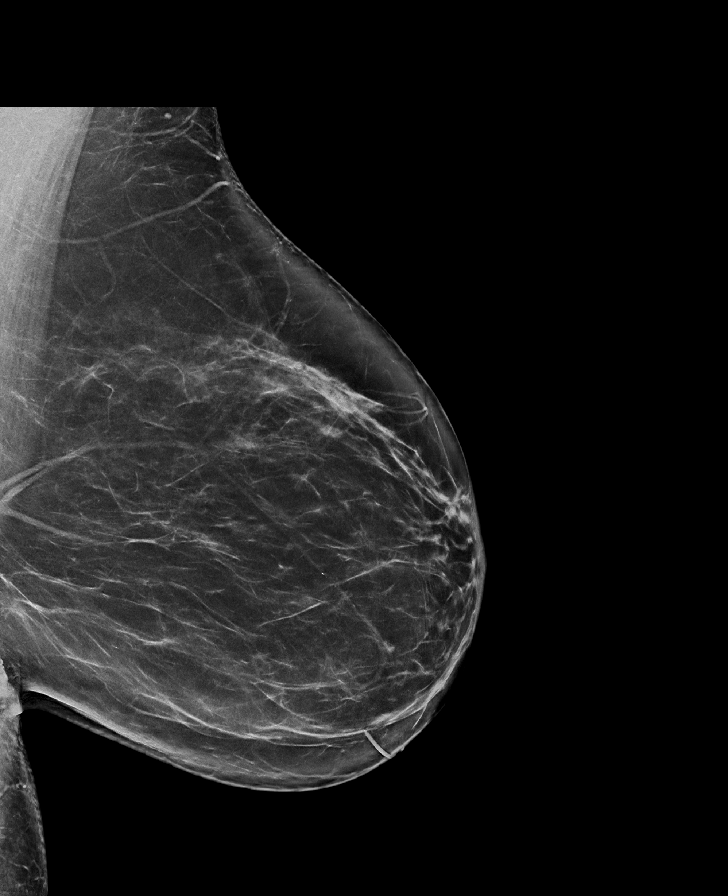

[L CC synth-2D]
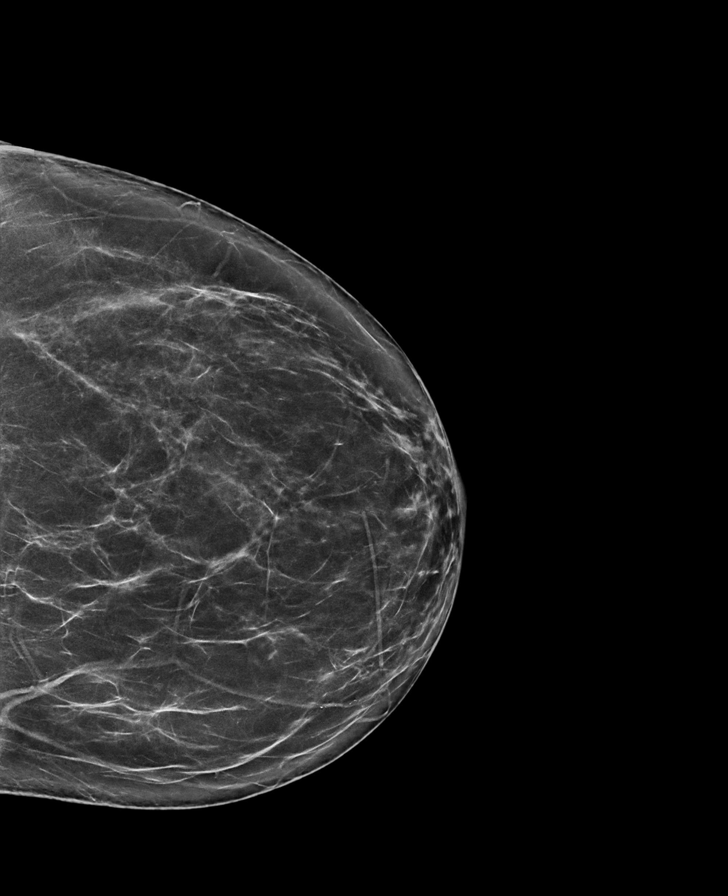

[R CC synth-2D]
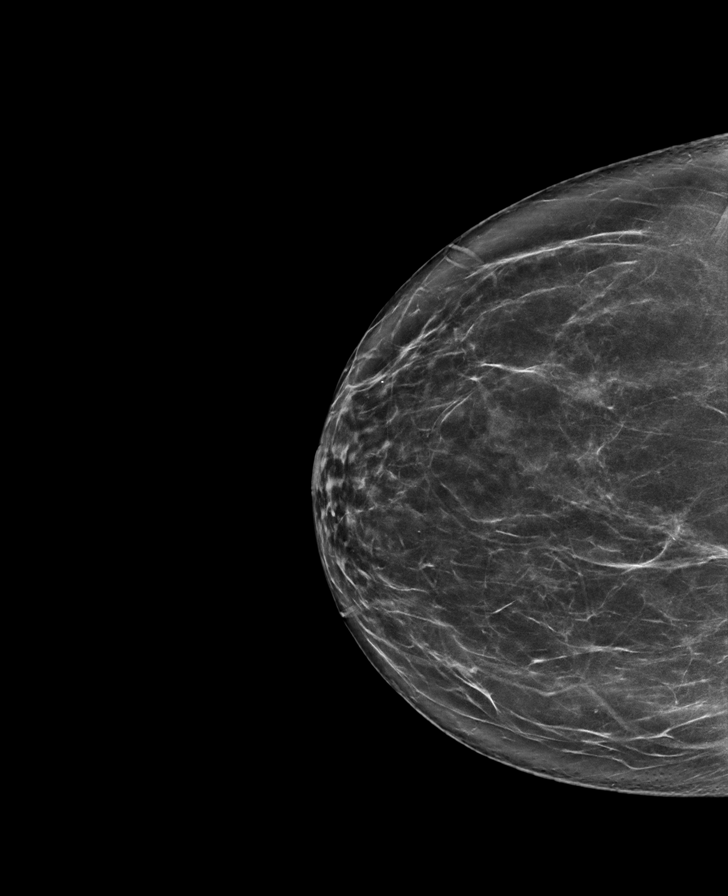

[R MLO tomo · tomo slice 42/83.0]
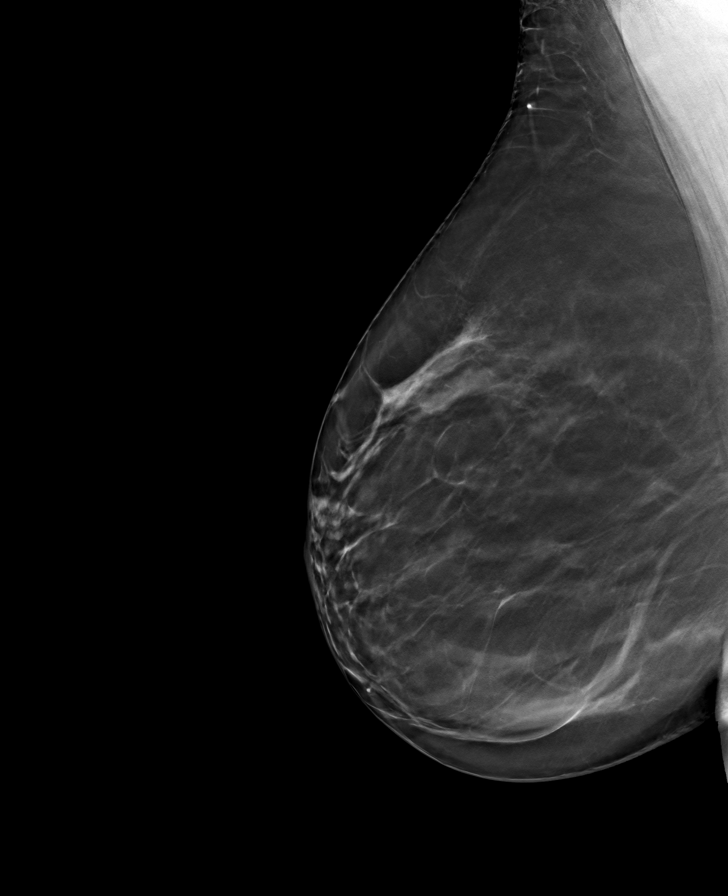

[L CC tomo · tomo slice 38/75.0]
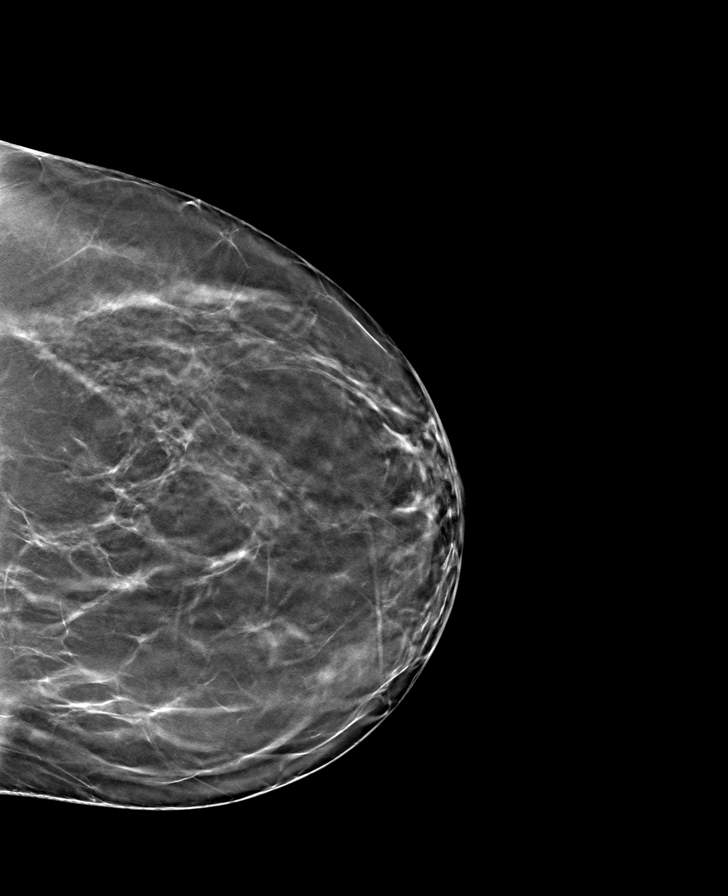

[L MLO tomo · tomo slice 43/85.0]
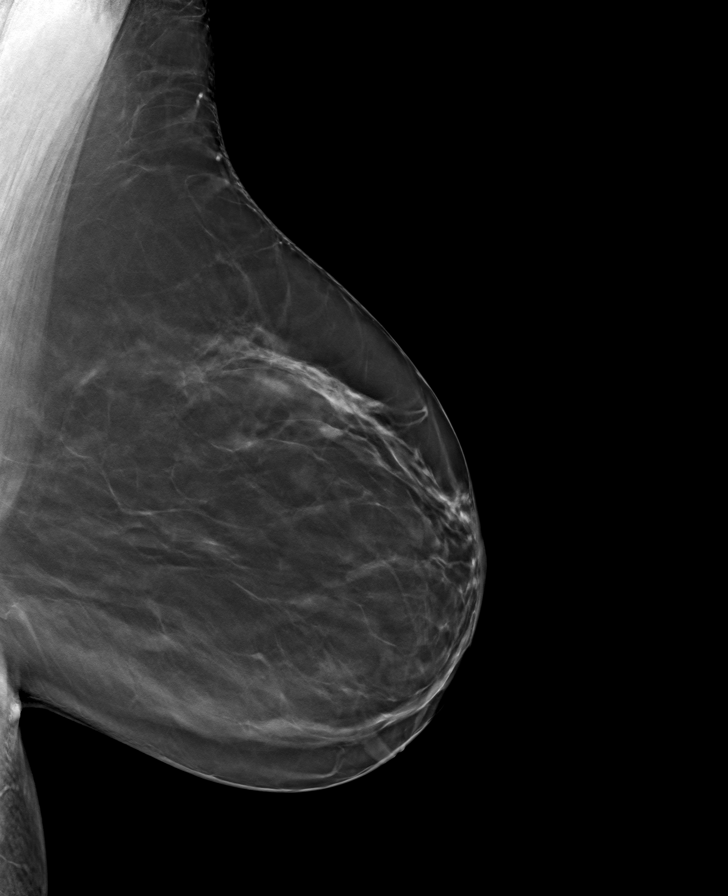

[R CC tomo · tomo slice 39/78.0]
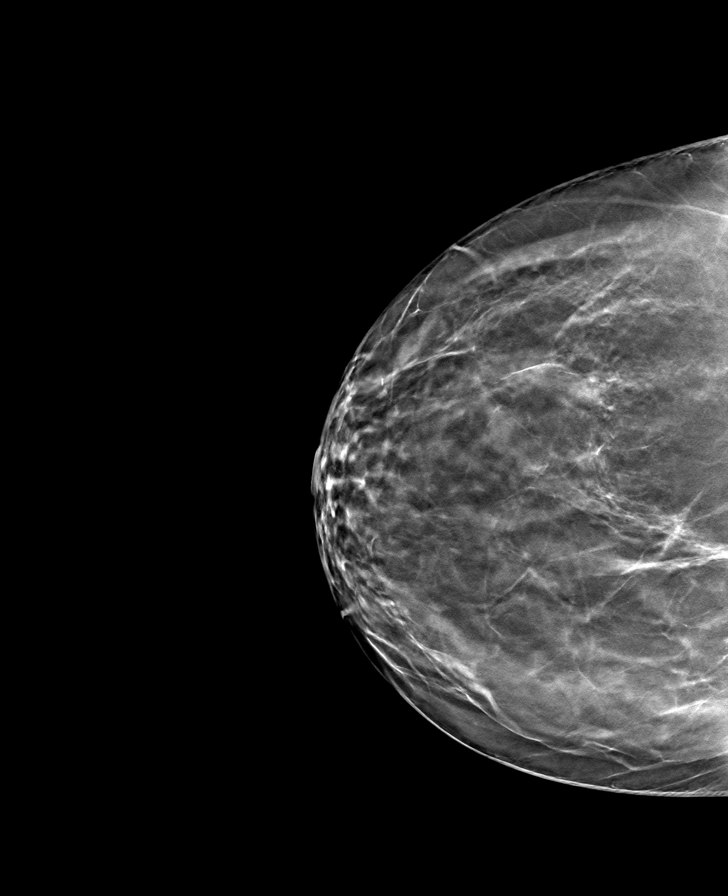

[8 of 24 positions shown; findings below may reference images not displayed]

ACR Breast Density Category b: There are scattered areas of
fibroglandular density.
FINDINGS: There are no findings suspicious for malignancy.
IMPRESSION: No mammographic evidence of malignancy. A result letter of this
screening mammogram will be mailed directly to the patient.

RECOMMENDATION:
Screening mammogram in one year. (Code:51-O-LD2)

BI-RADS CATEGORY  1: Negative.

## 2022-02-10 ENCOUNTER — Other Ambulatory Visit: Payer: Self-pay | Admitting: Obstetrics & Gynecology

## 2022-02-10 DIAGNOSIS — Z1231 Encounter for screening mammogram for malignant neoplasm of breast: Secondary | ICD-10-CM

## 2022-09-10 ENCOUNTER — Ambulatory Visit: Admission: RE | Admit: 2022-09-10 | Payer: BC Managed Care – PPO | Source: Ambulatory Visit

## 2022-09-10 ENCOUNTER — Ambulatory Visit: Payer: BC Managed Care – PPO

## 2022-09-10 DIAGNOSIS — Z1231 Encounter for screening mammogram for malignant neoplasm of breast: Secondary | ICD-10-CM

## 2024-02-13 ENCOUNTER — Other Ambulatory Visit: Payer: Self-pay | Admitting: Obstetrics & Gynecology

## 2024-02-13 DIAGNOSIS — Z1231 Encounter for screening mammogram for malignant neoplasm of breast: Secondary | ICD-10-CM

## 2024-02-15 ENCOUNTER — Inpatient Hospital Stay
Admission: RE | Admit: 2024-02-15 | Discharge: 2024-02-15 | Attending: Obstetrics & Gynecology | Admitting: Obstetrics & Gynecology

## 2024-02-15 DIAGNOSIS — Z1231 Encounter for screening mammogram for malignant neoplasm of breast: Secondary | ICD-10-CM
